# Patient Record
Sex: Female | Born: 1935 | Race: Black or African American | Hispanic: No | Smoking: Former smoker
Health system: Southern US, Community
[De-identification: ages and names within clinical notes are randomized; demographics above are authoritative.]

## PROBLEM LIST (undated history)

## (undated) DIAGNOSIS — Z9981 Dependence on supplemental oxygen: Secondary | ICD-10-CM

## (undated) DIAGNOSIS — I509 Heart failure, unspecified: Secondary | ICD-10-CM

---

## 2006-11-15 ENCOUNTER — Ambulatory Visit: Payer: Self-pay | Admitting: Gastroenterology

## 2008-01-29 ENCOUNTER — Ambulatory Visit: Payer: Self-pay | Admitting: Unknown Physician Specialty

## 2008-02-05 ENCOUNTER — Inpatient Hospital Stay: Payer: Self-pay | Admitting: Unknown Physician Specialty

## 2009-01-14 ENCOUNTER — Ambulatory Visit: Payer: Self-pay | Admitting: Unknown Physician Specialty

## 2009-01-27 ENCOUNTER — Inpatient Hospital Stay: Payer: Self-pay | Admitting: Unknown Physician Specialty

## 2009-02-01 ENCOUNTER — Ambulatory Visit: Payer: Self-pay | Admitting: Family Medicine

## 2010-06-09 ENCOUNTER — Ambulatory Visit: Payer: Self-pay | Admitting: Ophthalmology

## 2010-06-17 ENCOUNTER — Ambulatory Visit: Payer: Self-pay | Admitting: Ophthalmology

## 2011-09-09 ENCOUNTER — Ambulatory Visit: Payer: Self-pay | Admitting: Family

## 2012-06-21 ENCOUNTER — Ambulatory Visit: Payer: Self-pay | Admitting: Specialist

## 2012-07-13 ENCOUNTER — Ambulatory Visit: Payer: Self-pay | Admitting: Specialist

## 2014-04-22 DIAGNOSIS — G4733 Obstructive sleep apnea (adult) (pediatric): Secondary | ICD-10-CM | POA: Diagnosis present

## 2016-07-01 ENCOUNTER — Other Ambulatory Visit: Payer: Self-pay | Admitting: Gerontology

## 2016-07-01 ENCOUNTER — Ambulatory Visit
Admission: RE | Admit: 2016-07-01 | Discharge: 2016-07-01 | Disposition: A | Discharge status: Home / Self Care | Payer: Medicare (Managed Care) | Source: Ambulatory Visit | Attending: Gerontology | Admitting: Gerontology

## 2016-07-01 DIAGNOSIS — R05 Cough: Secondary | ICD-10-CM

## 2016-07-01 DIAGNOSIS — R059 Cough, unspecified: Secondary | ICD-10-CM

## 2016-07-01 DIAGNOSIS — N289 Disorder of kidney and ureter, unspecified: Secondary | ICD-10-CM | POA: Diagnosis not present

## 2016-07-01 DIAGNOSIS — R1084 Generalized abdominal pain: Secondary | ICD-10-CM

## 2016-07-01 DIAGNOSIS — K8020 Calculus of gallbladder without cholecystitis without obstruction: Secondary | ICD-10-CM | POA: Diagnosis not present

## 2016-07-01 DIAGNOSIS — R109 Unspecified abdominal pain: Secondary | ICD-10-CM | POA: Insufficient documentation

## 2016-07-01 DIAGNOSIS — R630 Anorexia: Secondary | ICD-10-CM

## 2016-07-01 DIAGNOSIS — I700 Atherosclerosis of aorta: Secondary | ICD-10-CM | POA: Diagnosis not present

## 2018-09-02 IMAGING — CR DG CHEST 2V
1 series · 2 of 2 positions shown · non-contrast
Comparison: None

CLINICAL DATA: Cough and fever.

EXAM:
CHEST  2 VIEW

[Series 1: dg chest 2 view · 0.14mm/px · 2 of 2 slices shown]
[im 1/2]
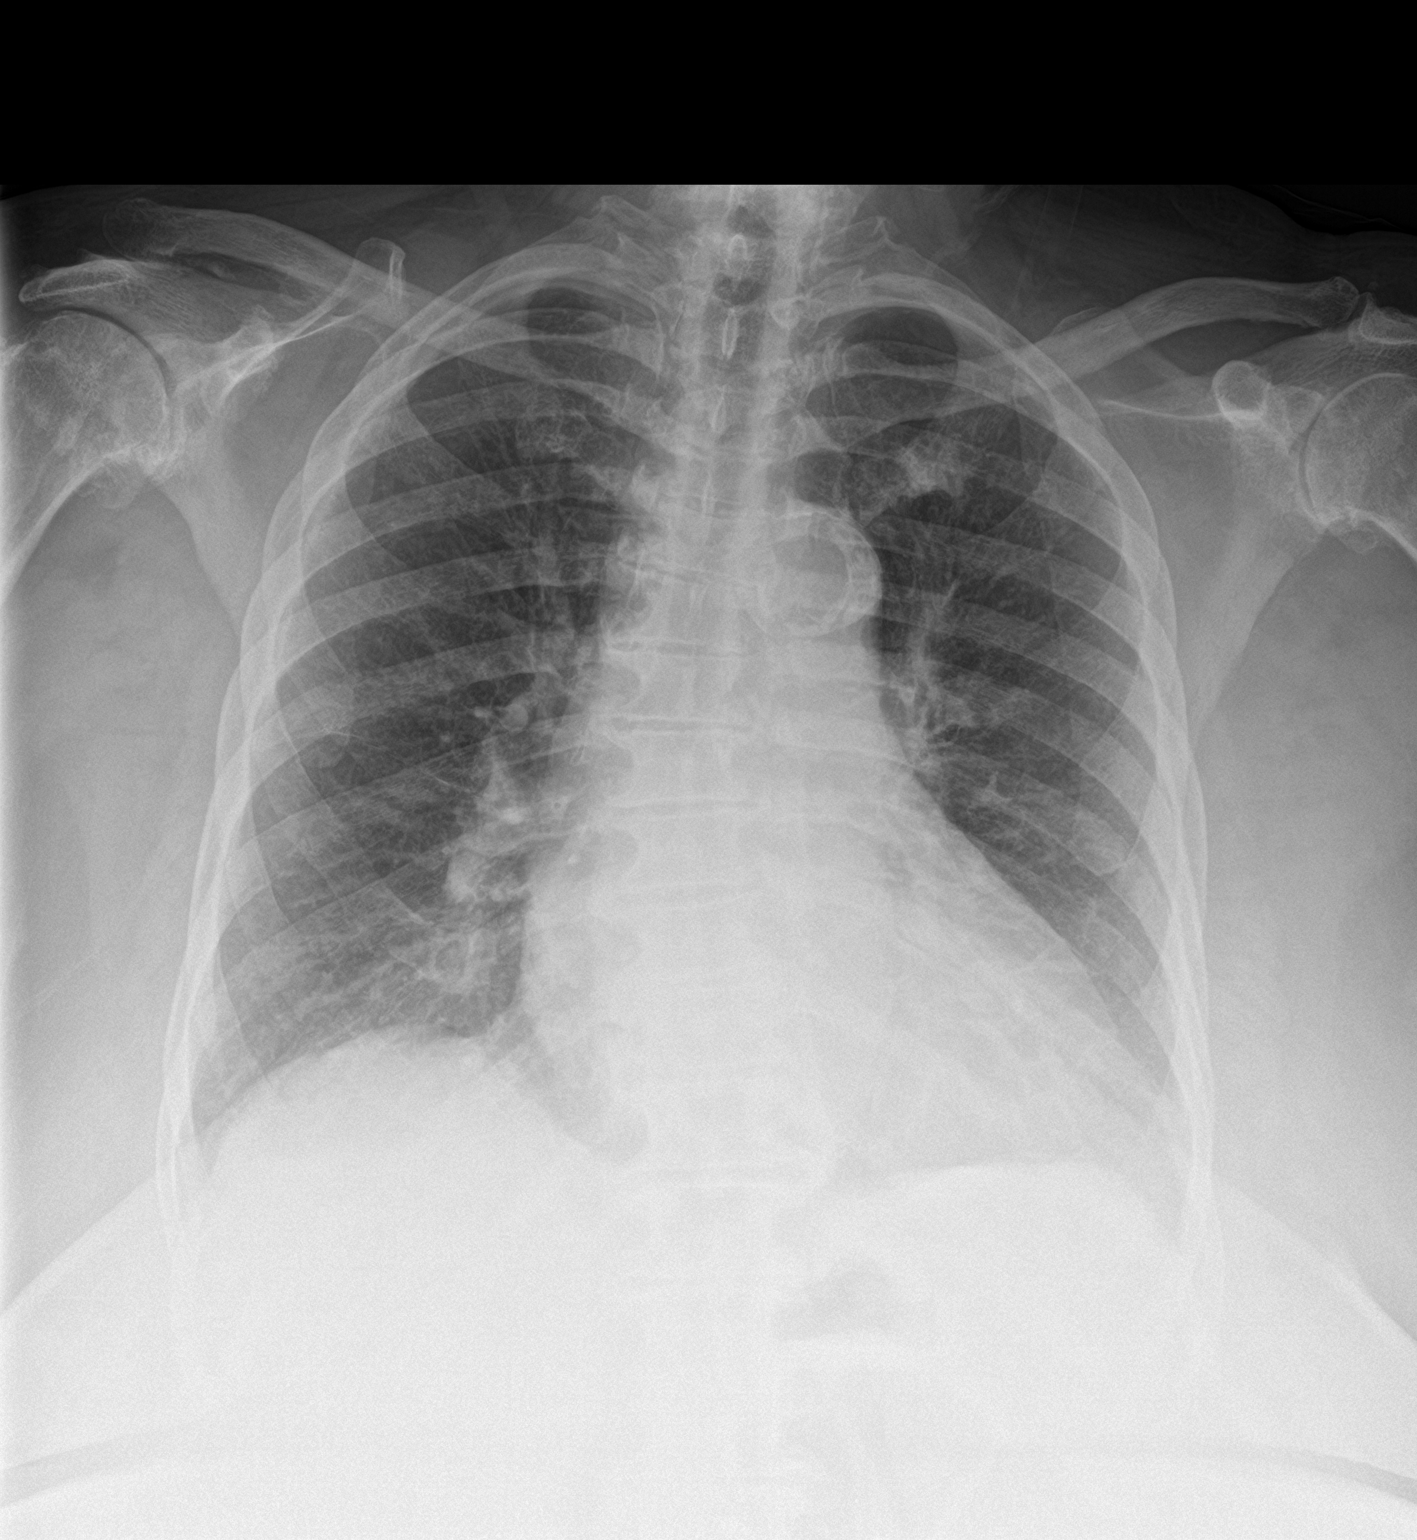
[im 2/2]
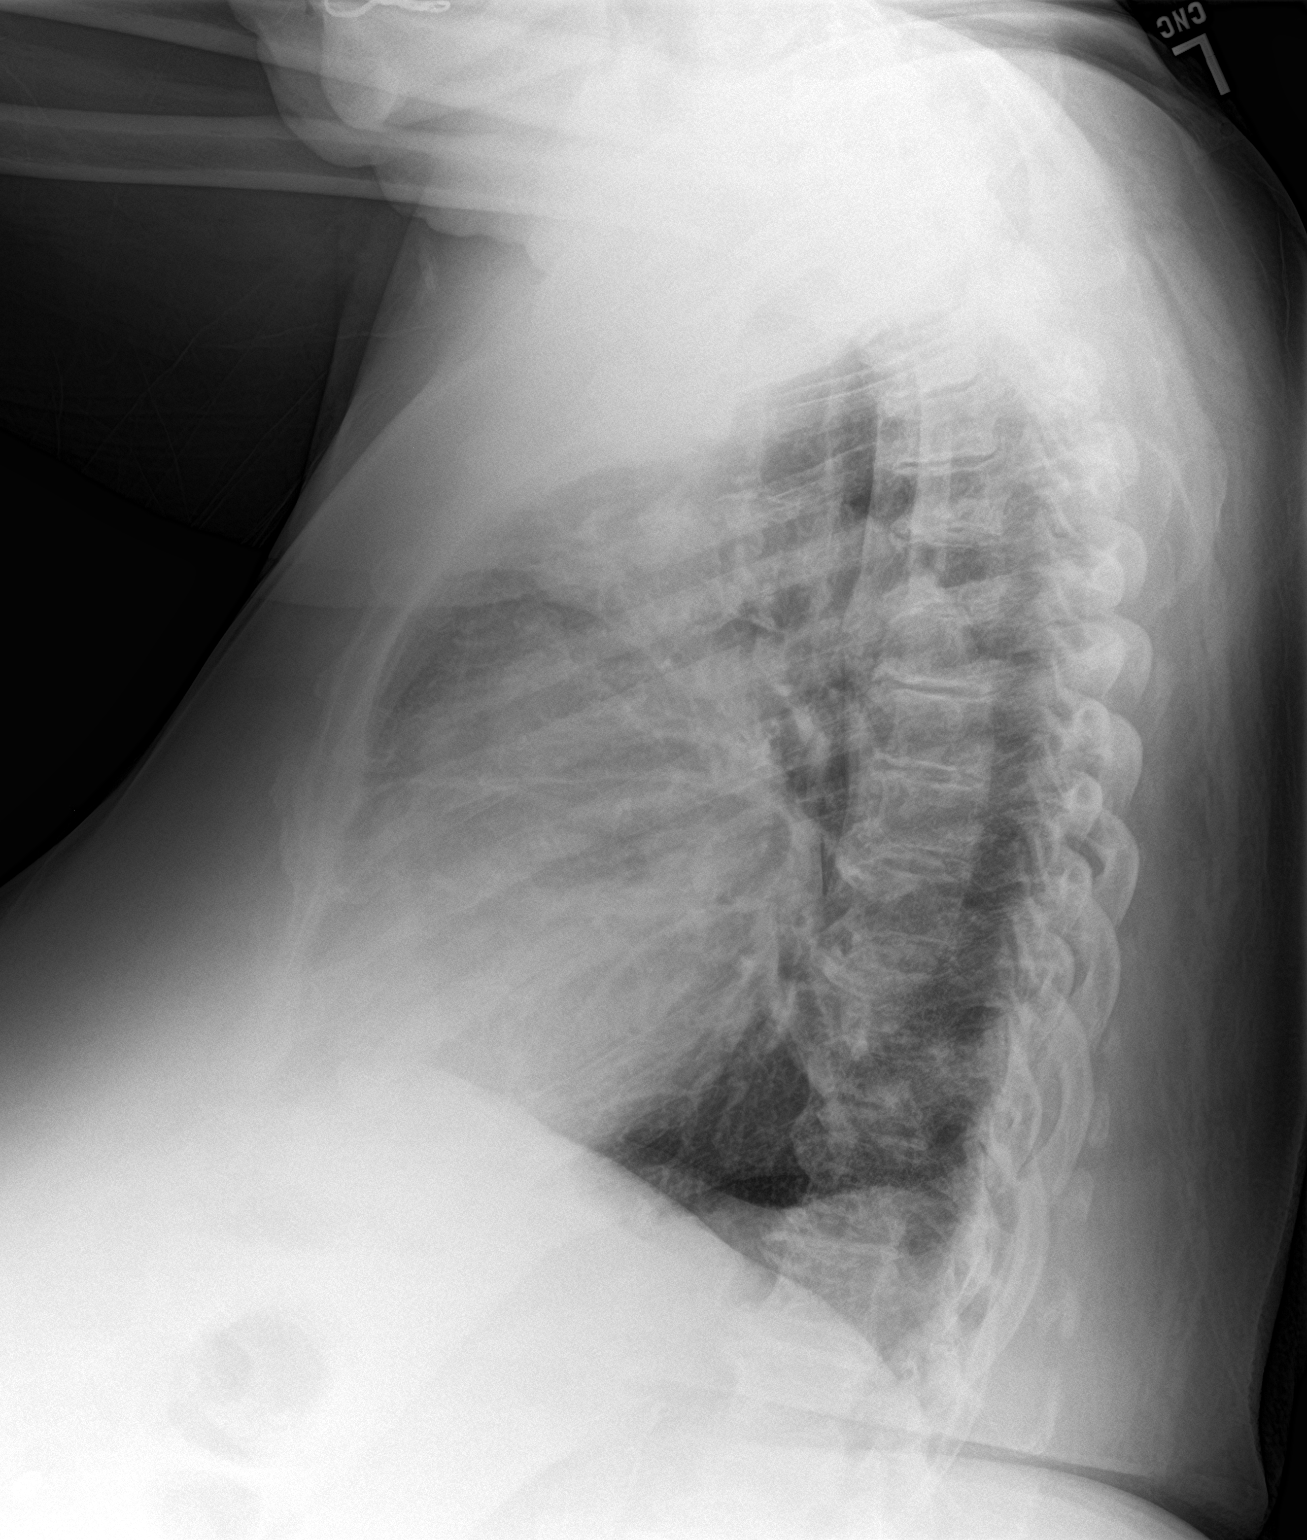

[2 of 2 positions shown; findings below may reference images not displayed]

FINDINGS: There is mild cardiac enlargement. Aortic atherosclerosis.
Calcification involving the aortic arch noted. No pleural effusion
or edema. No airspace opacities. Multi level spondylosis within the
thoracic spine.
IMPRESSION: 1. No acute findings.
2. Aortic atherosclerosis.

## 2022-07-17 ENCOUNTER — Emergency Department: Payer: Medicare (Managed Care)

## 2022-07-17 ENCOUNTER — Other Ambulatory Visit: Payer: Self-pay

## 2022-07-17 ENCOUNTER — Emergency Department
Admission: EM | Admit: 2022-07-17 | Discharge: 2022-07-17 | Disposition: A | Discharge status: Home / Self Care | Payer: Medicare (Managed Care) | Attending: Emergency Medicine | Admitting: Emergency Medicine

## 2022-07-17 DIAGNOSIS — W010XXA Fall on same level from slipping, tripping and stumbling without subsequent striking against object, initial encounter: Secondary | ICD-10-CM | POA: Diagnosis not present

## 2022-07-17 DIAGNOSIS — W19XXXA Unspecified fall, initial encounter: Secondary | ICD-10-CM

## 2022-07-17 DIAGNOSIS — M25522 Pain in left elbow: Secondary | ICD-10-CM | POA: Diagnosis not present

## 2022-07-17 DIAGNOSIS — Z87891 Personal history of nicotine dependence: Secondary | ICD-10-CM | POA: Insufficient documentation

## 2022-07-17 DIAGNOSIS — R4182 Altered mental status, unspecified: Secondary | ICD-10-CM | POA: Insufficient documentation

## 2022-07-17 DIAGNOSIS — M25512 Pain in left shoulder: Secondary | ICD-10-CM | POA: Diagnosis present

## 2022-07-17 LAB — URINALYSIS, W/ REFLEX TO CULTURE (INFECTION SUSPECTED)
Bilirubin Urine: NEGATIVE
Glucose, UA: >=500 mg/dL — AB
Ketones, ur: NEGATIVE mg/dL
Leukocytes,Ua: NEGATIVE
Nitrite: NEGATIVE
Protein, ur: 30 mg/dL — AB
Specific Gravity, Urine: 1.016 (ref 1.005–1.030)
pH: 6 (ref 5.0–8.0)

## 2022-07-17 LAB — COMPREHENSIVE METABOLIC PANEL
ALT: 10 U/L (ref 0–44)
AST: 32 U/L (ref 15–41)
Albumin: 3.3 g/dL — ABNORMAL LOW (ref 3.5–5.0)
Alkaline Phosphatase: 47 U/L (ref 38–126)
Anion gap: 16 — ABNORMAL HIGH (ref 5–15)
BUN: 26 mg/dL — ABNORMAL HIGH (ref 8–23)
CO2: 42 mmol/L — ABNORMAL HIGH (ref 22–32)
Calcium: 8.5 mg/dL — ABNORMAL LOW (ref 8.9–10.3)
Chloride: 81 mmol/L — ABNORMAL LOW (ref 98–111)
Creatinine, Ser: 0.84 mg/dL (ref 0.44–1.00)
GFR, Estimated: >60 mL/min (ref >60)
Glucose, Bld: 139 mg/dL — ABNORMAL HIGH (ref 70–99)
Potassium: 3 mmol/L — ABNORMAL LOW (ref 3.5–5.1)
Sodium: 139 mmol/L (ref 135–145)
Total Bilirubin: 1.6 mg/dL — ABNORMAL HIGH (ref 0.3–1.2)
Total Protein: 7.5 g/dL (ref 6.5–8.1)

## 2022-07-17 LAB — CBC WITH DIFFERENTIAL/PLATELET
Abs Immature Granulocytes: 0.05 10*3/uL (ref 0.00–0.07)
Basophils Absolute: 0 10*3/uL (ref 0.0–0.1)
Basophils Relative: 0 %
Eosinophils Absolute: 0 10*3/uL (ref 0.0–0.5)
Eosinophils Relative: 0 %
HCT: 46.1 % — ABNORMAL HIGH (ref 36.0–46.0)
Hemoglobin: 13.9 g/dL (ref 12.0–15.0)
Immature Granulocytes: 1 %
Lymphocytes Relative: 8 %
Lymphs Abs: 0.6 10*3/uL — ABNORMAL LOW (ref 0.7–4.0)
MCH: 32.7 pg (ref 26.0–34.0)
MCHC: 30.2 g/dL (ref 30.0–36.0)
MCV: 108.5 fL — ABNORMAL HIGH (ref 80.0–100.0)
Monocytes Absolute: 0.6 10*3/uL (ref 0.1–1.0)
Monocytes Relative: 7 %
Neutro Abs: 6.8 10*3/uL (ref 1.7–7.7)
Neutrophils Relative %: 84 %
Platelets: 160 10*3/uL (ref 150–400)
RBC: 4.25 MIL/uL (ref 3.87–5.11)
RDW: 13.2 % (ref 11.5–15.5)
WBC: 8.1 10*3/uL (ref 4.0–10.5)
nRBC: 0 % (ref 0.0–0.2)

## 2022-07-17 NOTE — ED Notes (Signed)
[facility] Called for transport to pts home

## 2022-07-17 NOTE — ED Notes (Signed)
EMS arrived to take patient home. Family at bedside.

## 2022-07-17 NOTE — ED Notes (Signed)
Pt states she slid out of wheelchair, c/o left arm hand and shoulder pain, unsure if on blood thinners. No bleeding or obvious deformity noted. Rugburn/abrasion noted on right wrist.

## 2022-07-17 NOTE — ED Triage Notes (Signed)
Pt presents via EMS from home. C/o fall at home. EMS reports uses a walker to get around living room. Wears 3L 02 at home. C/o left shoulder and left elbow pain. EMS reports pt is altered. A&Ox2 currently however usually A&O x4.

## 2022-07-17 NOTE — ED Notes (Signed)
Patient taken to CT scan. Patient was yelling for her family and states she wants to go home.

## 2022-07-17 NOTE — ED Notes (Signed)
Patient was cleaned up after being incontinent to urine. Linens were changed. Patient began urinating and urine specimen was sent. Patient tolerated procedure well.

## 2022-07-17 NOTE — ED Provider Notes (Signed)
[facility] Emergency Department Provider Note   ____________________________________________   Event Date/Time   First MD Initiated Contact with Patient 07/17/22 1524     (approximate)  I have reviewed the triage vital signs and the nursing notes.   HISTORY  Chief Complaint Fall    HPI Sheena Long is a 89 y.o. female presents to the emergency room via EMS for reported fall.  According to EMS report patient does ambulate around her home with a walker and is on continuous O2 at 3 L.  EMS reported the patient had tripped and fell.  However, on discussion with patient's nephew he states that she slid out of the recliner at 2 AM this morning and then today when they went to check on her they found her in the floor.  Nephew was adamant that she did not hit her head.  However, fall was unwitnessed.  Nephew states that it is not uncommon for patient to "slide out of the chair" several times a week.  Patient has an alarm system that she activates and it will notify him when he goes and gets her out of the floor.  Nephew also reports that patient is alert and oriented x 4.  But on exam, patient is alert and oriented x 2.  Patient states that she does not walk and that she did not fall.  Patient reports that she has had ongoing pain in her left shoulder and left arm.  Patient later reports that she hit her head when she fell into the chair.  It is hard to ascertain what actually happened other than patient had unwitnessed fall and will be treated for such.  Patient is noted to have bruising to her face.  Nephew states they noticed that this morning and are unsure where that came from.   History reviewed. No pertinent past medical history.  There are no problems to display for this patient.   History reviewed. No pertinent surgical history.  Prior to Admission medications   Not on File    Allergies Patient has no allergy information on record.  History  reviewed. No pertinent family history.  Social History Social History   Tobacco Use   Smoking status: Former    Types: Cigarettes   Smokeless tobacco: Never    Review of Systems  Constitutional: No fever/chills Eyes: No visual changes. ENT: No sore throat. Cardiovascular: Denies chest pain. Respiratory: Denies shortness of breath. Gastrointestinal: No abdominal pain.  No nausea, no vomiting.  No diarrhea.  No constipation. Genitourinary: Negative for dysuria. Musculoskeletal: Complains of left shoulder and left elbow pain Skin: Negative for rash. Neurological: Negative for headaches, focal weakness or numbness.   ____________________________________________   PHYSICAL EXAM:  VITAL SIGNS: ED Triage Vitals  Enc Vitals Group     BP 07/17/22 1404 101/85     Pulse Rate 07/17/22 1404 94     Resp 07/17/22 1404 18     Temp 07/17/22 1404 97.6 F (36.4 C)     Temp Source 07/17/22 1404 Oral     SpO2 07/17/22 1404 94 %     Weight --      Height --      Head Circumference --      Peak Flow --      Pain Score 07/17/22 1406 5     Pain Loc --      Pain Edu? --      Excl. in GC? --     Constitutional:  Alert and oriented x 2.  However, her baseline is reported to be oriented x 4. Well appearing and in no acute distress. Eyes: Conjunctivae are normal. PERRL. EOMI. Head: Bruising under bilateral eyes and on bilateral cheeks.  Worse to right cheek than left. Nose: No congestion/rhinnorhea. Mouth/Throat: Mucous membranes are moist.   Neck: No stridor.   Cardiovascular: Normal rate, regular rhythm. Grossly normal heart sounds.  Good peripheral circulation. Respiratory: Normal respiratory effort.  No retractions. Lungs CTAB. Gastrointestinal: Soft and nontender. No distention. No abdominal bruits.  Musculoskeletal: Patient has pain to bilateral lower extremities with weakness.  This is at her baseline. Neurologic:  Normal speech and language. No gross focal neurologic deficits are  appreciated, however, patient is alert and oriented x 2 when nephew reports her baseline is x 4.  Patient has altered gait at baseline. Skin:  Skin is warm, dry and intact. No rash noted. Psychiatric: Mood and affect are normal. Speech and behavior are normal.  ____________________________________________   LABS (all labs ordered are listed, but only abnormal results are displayed)  Labs Reviewed  CBC WITH DIFFERENTIAL/PLATELET - Abnormal; Notable for the following components:      Result Value   HCT 46.1 (*)    MCV 108.5 (*)    Lymphs Abs 0.6 (*)    All other components within normal limits  COMPREHENSIVE METABOLIC PANEL - Abnormal; Notable for the following components:   Potassium 3.0 (*)    Chloride 81 (*)    CO2 42 (*)    Glucose, Bld 139 (*)    BUN 26 (*)    Calcium 8.5 (*)    Albumin 3.3 (*)    Total Bilirubin 1.6 (*)    Anion gap 16 (*)    All other components within normal limits  URINALYSIS, W/ REFLEX TO CULTURE (INFECTION SUSPECTED) - Abnormal; Notable for the following components:   Color, Urine YELLOW (*)    APPearance CLEAR (*)    Glucose, UA >=500 (*)    Hgb urine dipstick MODERATE (*)    Protein, ur 30 (*)    Bacteria, UA RARE (*)    All other components within normal limits   ____________________________________________  EKG   ____________________________________________  RADIOLOGY  ED MD interpretation: CT reviewed by me and read by radiologist.  X-ray reviewed by me and read by radiologist.  Official radiology report(s): CT Head Wo Contrast  Result Date: 07/17/2022 CLINICAL DATA:  Altered mental status EXAM: CT HEAD WITHOUT CONTRAST TECHNIQUE: Contiguous axial images were obtained from the base of the skull through the vertex without intravenous contrast. RADIATION DOSE REDUCTION: This exam was performed according to the departmental dose-optimization program which includes automated exposure control, adjustment of the mA and/or kV according to  patient size and/or use of iterative reconstruction technique. COMPARISON:  None Available. FINDINGS: Brain: No evidence of acute infarction, hemorrhage, hydrocephalus, extra-axial collection or mass lesion/mass effect. There is mild periventricular white matter hypodensity, likely chronic small vessel ischemic change. Vascular: Atherosclerotic calcifications are present within the cavernous internal carotid arteries. Skull: Normal. Negative for fracture or focal lesion. Sinuses/Orbits: There is a small air-fluid level and mucosal thickening of the left maxillary sinus. Orbits are within normal limits. Other: None. IMPRESSION: 1. No acute intracranial process. 2. Mild chronic small vessel ischemic change. 3. Left maxillary sinus disease. Electronically Signed   By: Darliss Cheney M.D.   On: 07/17/2022 17:11   DG Elbow Complete Left  Result Date: 07/17/2022 CLINICAL DATA:  Left elbow pain.  No reported injury. EXAM: LEFT ELBOW - COMPLETE 3+ VIEW COMPARISON:  None Available. FINDINGS: Large posterior olecranon enthesophyte. Minimal coronoid process spur formation. No fracture, dislocation or definite effusion. IMPRESSION: Large posterior olecranon enthesophyte and minimal coronoid process spur formation. Electronically Signed   By: Beckie Salts M.D.   On: 07/17/2022 16:23   DG Shoulder Left  Result Date: 07/17/2022 CLINICAL DATA:  Left shoulder pain.  No reported injury. EXAM: LEFT SHOULDER - 2+ VIEW COMPARISON:  None Available. FINDINGS: Marked glenohumeral joint space narrowing with moderate inferior spur formation. Mild-to-moderate acromioclavicular spur formation and corticated fragmentation. Fracture or dislocation seen. Dense aortic arch atheromatous calcifications. IMPRESSION: Marked glenohumeral and mild-to-moderate acromioclavicular degenerative changes. Electronically Signed   By: Beckie Salts M.D.   On: 07/17/2022 16:22    ____________________________________________   PROCEDURES  Procedure(s)  performed: None  Procedures  Critical Care performed: No  ____________________________________________   INITIAL IMPRESSION / ASSESSMENT AND PLAN / ED COURSE     Sheena Long is a 89 y.o. female presents to the emergency room via EMS for reported fall.  According to EMS report patient does ambulate around her home with a walker and is on continuous O2 at 3 L.  EMS reported the patient had tripped and fell.  However, on discussion with patient's nephew he states that she slid out of the recliner at 2 AM this morning and then today when they went to check on her they found her in the floor.  Nephew was adamant that she did not hit her head.  However, fall was unwitnessed.  Nephew also reports that patient is alert and oriented x 4.  But on exam, patient is alert and oriented x 2.  Patient states that she does not walk and that she did not fall.  Patient reports that she has had ongoing pain in her left shoulder and left arm.  Patient later reports that she hit her head when she fell into the chair.  It is hard to ascertain what actually happened other than patient had unwitnessed fall and will be treated for such.  Patient is noted to have bruising to her face.  Nephew states they noticed that this morning and are unsure where that came from.  CBC and CMP were obtained while in triage. Based on fall being unwitnessed and patient stating that she did hit her head as well as bruising noted to the face, I will order CT of the head. Will also order urinalysis as patient has strong odor of urine about her.  CM P shows potassium of 3.0, chloride 81, CO2 42, glucose 139, BUN 26, calcium 8.5, albumin 3.3, bilirubin 1.6, anion gap 16. CBC relatively unremarkable. Discussed with patient and her nephew the need for IV hydration and for her to have potassium supplement given in the emergency room.  Patient declines at this time.  Nephew believes that she is oriented enough to make this decision on her  own.  I discussed her CMP results with him in detail and they declined any further intervention/treatment, stating that they want to get the x-rays and go home.  Left shoulder x-ray shows degenerative changes but no acute fracture or dislocation. Left elbow x-ray shows large posterior olecranon on enthesophyte and minimal coronoid process spur formation but no acute fracture or dislocation CT of head shows no acute intracranial processes.  UA shows greater than 500 glucose with positive hemoglobin/negative red blood cells and rare bacteria.  Will reflex with culture though suspicion  for UTI is low.  Based on lab results/x-ray/UA and patient's reassuring exam, I will discharge her home in stable condition at this time. Nephew is aware of discharge and would like EMS called to return her to her home.      ____________________________________________   FINAL CLINICAL IMPRESSION(S) / ED DIAGNOSES  Final diagnoses:  Fall, initial encounter  Altered mental status, unspecified altered mental status type     ED Discharge Orders     None        Note:  This document was prepared using Dragon voice recognition software and may include unintentional dictation errors.     Herschell Dimes, NP 07/17/22 Emelda Brothers    Phineas Semen, MD 07/17/22 [phone]

## 2022-07-17 NOTE — ED Notes (Signed)
Band aid applied to left elbow skin tear. Family at bedside. Packet of gel given to family for patient's lips. Patient has a shallow abrasion on left cheek and a shallow abrasion with redness on right wrist.

## 2022-07-17 NOTE — Discharge Instructions (Signed)
You have been seen today in the emergency room after having a unwitnessed fall.  Your workup is relatively reassuring.  Your x-rays/CT showed no signs of acute issues.  I recommend that you reach out to your primary care provider and follow-up with them within the week.

## 2022-07-28 ENCOUNTER — Encounter: Payer: Self-pay | Admitting: Emergency Medicine

## 2022-07-28 ENCOUNTER — Emergency Department: Payer: Medicare (Managed Care)

## 2022-07-28 ENCOUNTER — Other Ambulatory Visit: Payer: Self-pay

## 2022-07-28 ENCOUNTER — Emergency Department
Admission: EM | Admit: 2022-07-28 | Discharge: 2022-07-28 | Disposition: A | Discharge status: Home / Self Care | Payer: Medicare (Managed Care) | Attending: Emergency Medicine | Admitting: Emergency Medicine

## 2022-07-28 DIAGNOSIS — J168 Pneumonia due to other specified infectious organisms: Secondary | ICD-10-CM | POA: Insufficient documentation

## 2022-07-28 DIAGNOSIS — E876 Hypokalemia: Secondary | ICD-10-CM | POA: Diagnosis not present

## 2022-07-28 DIAGNOSIS — R0689 Other abnormalities of breathing: Secondary | ICD-10-CM | POA: Diagnosis present

## 2022-07-28 DIAGNOSIS — J189 Pneumonia, unspecified organism: Secondary | ICD-10-CM

## 2022-07-28 HISTORY — DX: Dependence on supplemental oxygen: Z99.81

## 2022-07-28 LAB — CBC WITH DIFFERENTIAL/PLATELET
Abs Immature Granulocytes: 0.02 10*3/uL (ref 0.00–0.07)
Basophils Absolute: 0 10*3/uL (ref 0.0–0.1)
Basophils Relative: 0 %
Eosinophils Absolute: 0.1 10*3/uL (ref 0.0–0.5)
Eosinophils Relative: 1 %
HCT: 46.1 % — ABNORMAL HIGH (ref 36.0–46.0)
Hemoglobin: 14 g/dL (ref 12.0–15.0)
Immature Granulocytes: 0 %
Lymphocytes Relative: 19 %
Lymphs Abs: 1.4 10*3/uL (ref 0.7–4.0)
MCH: 32.3 pg (ref 26.0–34.0)
MCHC: 30.4 g/dL (ref 30.0–36.0)
MCV: 106.5 fL — ABNORMAL HIGH (ref 80.0–100.0)
Monocytes Absolute: 0.7 10*3/uL (ref 0.1–1.0)
Monocytes Relative: 9 %
Neutro Abs: 5.4 10*3/uL (ref 1.7–7.7)
Neutrophils Relative %: 71 %
Platelets: 213 10*3/uL (ref 150–400)
RBC: 4.33 MIL/uL (ref 3.87–5.11)
RDW: 13.4 % (ref 11.5–15.5)
WBC: 7.6 10*3/uL (ref 4.0–10.5)
nRBC: 0 % (ref 0.0–0.2)

## 2022-07-28 LAB — COMPREHENSIVE METABOLIC PANEL
ALT: 10 U/L (ref 0–44)
AST: 18 U/L (ref 15–41)
Albumin: 3.3 g/dL — ABNORMAL LOW (ref 3.5–5.0)
Alkaline Phosphatase: 50 U/L (ref 38–126)
Anion gap: 14 (ref 5–15)
BUN: 26 mg/dL — ABNORMAL HIGH (ref 8–23)
CO2: 44 mmol/L — ABNORMAL HIGH (ref 22–32)
Calcium: 8.8 mg/dL — ABNORMAL LOW (ref 8.9–10.3)
Chloride: 82 mmol/L — ABNORMAL LOW (ref 98–111)
Creatinine, Ser: 0.95 mg/dL (ref 0.44–1.00)
GFR, Estimated: 58 mL/min — ABNORMAL LOW (ref >60)
Glucose, Bld: 81 mg/dL (ref 70–99)
Potassium: 2.9 mmol/L — ABNORMAL LOW (ref 3.5–5.1)
Sodium: 140 mmol/L (ref 135–145)
Total Bilirubin: 1.3 mg/dL — ABNORMAL HIGH (ref 0.3–1.2)
Total Protein: 7.5 g/dL (ref 6.5–8.1)

## 2022-07-28 MED ORDER — AZITHROMYCIN 500 MG PO TABS
500.0000 mg | ORAL_TABLET | Freq: Every day | ORAL | Status: DC
  Administered 2022-07-28: 500 mg via ORAL
  Filled 2022-07-28: qty 1

## 2022-07-28 MED ORDER — CEFDINIR 300 MG PO CAPS: 300.0000 mg | ORAL_CAPSULE | Freq: Two times a day (BID) | ORAL | 0 refills | Status: AC

## 2022-07-28 MED ORDER — POTASSIUM CHLORIDE 20 MEQ PO PACK
40.0000 meq | PACK | Freq: Two times a day (BID) | ORAL | Status: DC
  Administered 2022-07-28: 40 meq via ORAL
  Filled 2022-07-28: qty 2

## 2022-07-28 MED ORDER — CEFDINIR 300 MG PO CAPS
300.0000 mg | ORAL_CAPSULE | Freq: Two times a day (BID) | ORAL | Status: DC
  Filled 2022-07-28: qty 1

## 2022-07-28 MED ORDER — AZITHROMYCIN 250 MG PO TABS: ORAL_TABLET | ORAL | 0 refills | Status: DC

## 2022-07-28 MED ORDER — CEFDINIR 300 MG PO CAPS: 300.0000 mg | ORAL_CAPSULE | Freq: Two times a day (BID) | ORAL | Status: DC

## 2022-07-28 NOTE — Discharge Instructions (Signed)
It is very important that you take the antibiotics as prescribed for your pneumonia.  Please return if you have to increase your amount of oxygen or if you develop fever or any other new, worsening, or change in symptoms or other concerns.  It was a pleasure caring for you today.

## 2022-07-28 NOTE — ED Provider Triage Note (Signed)
Emergency Medicine Provider Triage Evaluation Note  Sheena Long , a 89 y.o. female  was evaluated in triage.  Pt states that family brought her here to be checked out. States that she fell at home yesterday.  Denies any injury or pain.  Normally on O2   Review of Systems  Positive: No injury.  Negative: No CP, SOB.  Physical Exam  BP 125/73 (BP Location: Left Arm)   Pulse 65   Temp 98 F (36.7 C)   Resp 20   Ht 5' (1.524 m)   Wt 83.9 kg   SpO2 97%   BMI 36.13 kg/m  Gen:   Awake, no distress  Talkative, answering questions.   Resp:  Normal effort   Lungs clear MSK:   Moves extremities without difficulty  Other:    Medical Decision Making  Medically screening exam initiated at 2:13 PM.  Appropriate orders placed.  Sheena Long was informed that the remainder of the evaluation will be completed by another provider, this initial triage assessment does not replace that evaluation, and the importance of remaining in the ED until their evaluation is complete.     Tommi Rumps, PA-C 07/28/22 1418

## 2022-07-28 NOTE — ED Triage Notes (Signed)
Arrives today c/o fall  yesterday.  Denies complaint.  Wearing 3l/ [place]

## 2022-07-28 NOTE — ED Provider Notes (Signed)
[facility] Provider Note    Event Date/Time   First MD Initiated Contact with Patient 07/28/22 1502     (approximate)   History   Fall   HPI  Sheena Long is a 89 y.o. female who presents today for evaluation of "change in her lung sounds" according to her nephew who is with her.  Nephew reports that he sees her every day and she has been acting her normal self.  She has not had any fevers or chills.  He reports that she has not had a cough.  There was no fall according to the nephew, contrary to triage note.  Patient wears 3 L via nasal cannula at all times according to nephew, she has not had to increase this at all recently.  She came over here according to the nephew because the doctor at the facility wanted her to have an x-ray and they did not have x-ray capability at her facility.  Patient denies any complaints currently.       Physical Exam   Triage Vital Signs: ED Triage Vitals  Enc Vitals Group     BP 07/28/22 1353 125/73     Pulse Rate 07/28/22 1353 65     Resp 07/28/22 1353 20     Temp 07/28/22 1353 98 F (36.7 C)     Temp src --      SpO2 07/28/22 1353 97 %     Weight 07/28/22 1410 185 lb (83.9 kg)     Height 07/28/22 1410 5' (1.524 m)     Head Circumference --      Peak Flow --      Pain Score 07/28/22 1410 0     Pain Loc --      Pain Edu? --      Excl. in GC? --     Most recent vital signs: Vitals:   07/28/22 1652 07/28/22 1757  BP:  123/75  Pulse:  67  Resp:  20  Temp:    SpO2: 98% 98%    Physical Exam Vitals and nursing note reviewed.  Constitutional:      General: Awake and alert. No acute distress.    Appearance: Normal appearance. The patient is normal weight.  HENT:     Head: Normocephalic and atraumatic.     Mouth: Mucous membranes are moist.  Eyes:     General: PERRL. Normal EOMs        Right eye: No discharge.        Left eye: No discharge.     Conjunctiva/sclera: Conjunctivae normal.   Cardiovascular:     Rate and Rhythm: Normal rate and regular rhythm.     Pulses: Normal pulses.  Pulmonary:     Effort: Pulmonary effort is normal. No respiratory distress.  No accessory muscle use.  Able to speak easily in complete sentences.  On 3 L nasal cannula.    Breath sounds: Rhonchi in left lower lung base, no wheezing or crackles Abdominal:     Abdomen is soft. There is no abdominal tenderness. No rebound or guarding. No distention. Musculoskeletal:        General: No swelling. Normal range of motion.     Cervical back: Normal range of motion and neck supple.  Skin:    General: Skin is warm and dry.     Capillary Refill: Capillary refill takes less than 2 seconds.     Findings: No rash.  Neurological:     Mental  Status: The patient is awake and alert.      ED Results / Procedures / Treatments   Labs (all labs ordered are listed, but only abnormal results are displayed) Labs Reviewed  CBC WITH DIFFERENTIAL/PLATELET - Abnormal; Notable for the following components:      Result Value   HCT 46.1 (*)    MCV 106.5 (*)    All other components within normal limits  COMPREHENSIVE METABOLIC PANEL - Abnormal; Notable for the following components:   Potassium 2.9 (*)    Chloride 82 (*)    CO2 44 (*)    BUN 26 (*)    Calcium 8.8 (*)    Albumin 3.3 (*)    Total Bilirubin 1.3 (*)    GFR, Estimated 58 (*)    All other components within normal limits     EKG     RADIOLOGY I independently reviewed and interpreted imaging and agree with radiologists findings.     PROCEDURES:  Critical Care performed:   Procedures   MEDICATIONS ORDERED IN ED: Medications  potassium chloride (KLOR-CON) packet 40 mEq (40 mEq Oral Given 07/28/22 1709)  azithromycin (ZITHROMAX) tablet 500 mg (500 mg Oral Given 07/28/22 1740)  cefdinir (OMNICEF) capsule 300 mg (has no administration in time range)     IMPRESSION / MDM / ASSESSMENT AND PLAN / ED COURSE  I reviewed the triage  vital signs and the nursing notes.   Differential diagnosis includes, but is not limited to, URI, cough, pneumonia, pneumothorax, bronchitis.  Patient is awake and alert, hemodynamically stable and afebrile.  She is with her nephew who reports that she is at her baseline.  There was no fall contrary to the triage note according to the nephew.  According to the nephew they are only here because of the need for a chest x-ray as her doctor at her facility noticed abnormal lung sounds and was unable to get an x-ray there.  There has not been any increase in her oxygen requirement.  Patient has not had any constitutional symptoms.  There has been no change in her mental status.  Labs obtained in triage are remarkable for hypokalemia to 2.9, similar to previous of 3.0.  No leukocytosis.  Her hypokalemia was repleted with p.o. potassium as she is not far from her baseline.  Chest x-ray reveals findings suspicious for left lower lobe pneumonia.  Given that patient has no change in her mental status, no leukocytosis, no fever, and has a normal oxygenation on her home oxygen, I do not feel that she needs to be admitted at this time.  I discussed this with her nephew who agrees.  However, we did discuss very strict return precautions in case of worsening illness.  Patient was given the first dose of her antibiotics in the emergency department.  I discussed with the nurse practitioner Verlon Au who cares for her at her living facility, and she agrees with plan.  We discussed return precautions and the importance of close outpatient follow-up.  Patient and her nephew understand and agree with plan.  Patient was transported back to her facility by transportation arranged by her facility.  Patient's presentation is most consistent with acute complicated illness / injury requiring diagnostic workup.     FINAL CLINICAL IMPRESSION(S) / ED DIAGNOSES   Final diagnoses:  Pneumonia of left lower lobe due to infectious  organism  Hypokalemia     Rx / DC Orders   ED Discharge Orders  Ordered    cefdinir (OMNICEF) 300 MG capsule  2 times daily        07/28/22 1653    azithromycin (ZITHROMAX Z-PAK) 250 MG tablet        07/28/22 1653             Note:  This document was prepared using Dragon voice recognition software and may include unintentional dictation errors.   Keturah Shavers 07/28/22 2241    Georga Hacking, MD 07/28/22 [phone]

## 2022-07-28 NOTE — ED Notes (Signed)
Pt facility transport vehicle arrived before pharmacy approved omnicef. Pt had to leave and medication was not given. Provider notified. Pt will get first dose in the morning at [place].

## 2022-09-18 ENCOUNTER — Emergency Department: Payer: Medicare (Managed Care)

## 2022-09-18 ENCOUNTER — Emergency Department
Admission: EM | Admit: 2022-09-18 | Discharge: 2022-09-19 | Disposition: A | Discharge status: Home / Self Care | Payer: Medicare (Managed Care) | Attending: Emergency Medicine | Admitting: Emergency Medicine

## 2022-09-18 ENCOUNTER — Other Ambulatory Visit: Payer: Self-pay

## 2022-09-18 ENCOUNTER — Encounter: Payer: Self-pay | Admitting: Emergency Medicine

## 2022-09-18 DIAGNOSIS — K5792 Diverticulitis of intestine, part unspecified, without perforation or abscess without bleeding: Secondary | ICD-10-CM

## 2022-09-18 DIAGNOSIS — R1084 Generalized abdominal pain: Secondary | ICD-10-CM | POA: Diagnosis present

## 2022-09-18 DIAGNOSIS — K5732 Diverticulitis of large intestine without perforation or abscess without bleeding: Secondary | ICD-10-CM | POA: Insufficient documentation

## 2022-09-18 DIAGNOSIS — I509 Heart failure, unspecified: Secondary | ICD-10-CM | POA: Insufficient documentation

## 2022-09-18 LAB — CBC WITH DIFFERENTIAL/PLATELET
Abs Immature Granulocytes: 0.03 10*3/uL (ref 0.00–0.07)
Basophils Absolute: 0 10*3/uL (ref 0.0–0.1)
Basophils Relative: 0 %
Eosinophils Absolute: 0.1 10*3/uL (ref 0.0–0.5)
Eosinophils Relative: 2 %
HCT: 43.5 % (ref 36.0–46.0)
Hemoglobin: 13.4 g/dL (ref 12.0–15.0)
Immature Granulocytes: 0 %
Lymphocytes Relative: 19 %
Lymphs Abs: 1.4 10*3/uL (ref 0.7–4.0)
MCH: 33.4 pg (ref 26.0–34.0)
MCHC: 30.8 g/dL (ref 30.0–36.0)
MCV: 108.5 fL — ABNORMAL HIGH (ref 80.0–100.0)
Monocytes Absolute: 0.8 10*3/uL (ref 0.1–1.0)
Monocytes Relative: 11 %
Neutro Abs: 4.9 10*3/uL (ref 1.7–7.7)
Neutrophils Relative %: 68 %
Platelets: 187 10*3/uL (ref 150–400)
RBC: 4.01 MIL/uL (ref 3.87–5.11)
RDW: 13.9 % (ref 11.5–15.5)
WBC: 7.2 10*3/uL (ref 4.0–10.5)
nRBC: 0 % (ref 0.0–0.2)

## 2022-09-18 LAB — COMPREHENSIVE METABOLIC PANEL
ALT: 8 U/L (ref 0–44)
AST: 17 U/L (ref 15–41)
Albumin: 3.4 g/dL — ABNORMAL LOW (ref 3.5–5.0)
Alkaline Phosphatase: 51 U/L (ref 38–126)
BUN: 27 mg/dL — ABNORMAL HIGH (ref 8–23)
CO2: >45 mmol/L — ABNORMAL HIGH (ref 22–32)
Calcium: 9.1 mg/dL (ref 8.9–10.3)
Chloride: 80 mmol/L — ABNORMAL LOW (ref 98–111)
Creatinine, Ser: 0.95 mg/dL (ref 0.44–1.00)
GFR, Estimated: 58 mL/min — ABNORMAL LOW (ref >60)
Glucose, Bld: 99 mg/dL (ref 70–99)
Potassium: 3.3 mmol/L — ABNORMAL LOW (ref 3.5–5.1)
Sodium: 143 mmol/L (ref 135–145)
Total Bilirubin: 1.2 mg/dL (ref 0.3–1.2)
Total Protein: 7.6 g/dL (ref 6.5–8.1)

## 2022-09-18 LAB — LACTIC ACID, PLASMA: Lactic Acid, Venous: 1.2 mmol/L (ref 0.5–1.9)

## 2022-09-18 LAB — LIPASE, BLOOD: Lipase: 22 U/L (ref 11–51)

## 2022-09-18 MED ORDER — AMOXICILLIN-POT CLAVULANATE 875-125 MG PO TABS
1.0000 | ORAL_TABLET | Freq: Once | ORAL | Status: AC
  Administered 2022-09-18: 1 via ORAL
  Filled 2022-09-18: qty 1

## 2022-09-18 MED ORDER — SODIUM CHLORIDE 0.9 % IV BOLUS: 1000.0000 mL | Freq: Once | INTRAVENOUS | Status: DC

## 2022-09-18 MED ORDER — AMOXICILLIN-POT CLAVULANATE 875-125 MG PO TABS: 1.0000 | ORAL_TABLET | Freq: Two times a day (BID) | ORAL | 0 refills | Status: AC

## 2022-09-18 MED ORDER — SODIUM CHLORIDE 0.9 % IV BOLUS
500.0000 mL | Freq: Once | INTRAVENOUS | Status: AC
  Administered 2022-09-18: 500 mL via INTRAVENOUS

## 2022-09-18 MED ORDER — IOHEXOL 300 MG/ML  SOLN
100.0000 mL | Freq: Once | INTRAMUSCULAR | Status: AC | PRN
  Administered 2022-09-18: 100 mL via INTRAVENOUS

## 2022-09-18 NOTE — ED Notes (Signed)
As we were preparing to use the bedpan to catch urine, the patient said she couldn't hold it, and released in her brief.  Her linens and bedding were changed.

## 2022-09-18 NOTE — ED Provider Notes (Signed)
[facility] Provider Note    Event Date/Time   First MD Initiated Contact with Patient 09/18/22 1839     (approximate)   History   Rectal Pain   HPI  Sheena Long is a 89 y.o. female   Past medical history of no documented PMH on medical record (attempt made to [facility] but no answer) but wears 3L [place] at baseline, medical history documented CHF history but echo in 2022 showed LV EF 55 to 60% and patient has no medical record documentation of diuretic therapy or cardiology notes, who is here from her living facility with rectal pain and diffuse abdominal pain over the last several days.  Has not had a bowel movement in several days.  Unclear if she is passing flatus.  She says she has had a an undisclosed unknown abdominal surgery many years ago.  She denies any dysuria or frequency.  No fever, chills, chest pain or respiratory symptoms.  Denies GI bleeding.   External Medical Documents Reviewed: 2022 echo showing LV EF 55-60%      Physical Exam   Triage Vital Signs: ED Triage Vitals [09/18/22 1822]  Enc Vitals Group     BP (!) 91/58     Pulse Rate 73     Resp 18     Temp 98.3 F (36.8 C)     Temp Source Oral     SpO2 94 %     Weight      Height      Head Circumference      Peak Flow      Pain Score 10     Pain Loc      Pain Edu?      Excl. in GC?     Most recent vital signs: Vitals:   09/18/22 2141 09/18/22 2142  BP: 119/70   Pulse: (!) 56   Resp: 18   Temp:  98.2 F (36.8 C)  SpO2: 97%     General: Awake, no distress. CV:  Good peripheral perfusion.  Resp:  Normal effort.  Abd:  No distention.  Other:  Soft abdomen, no rigidity or guarding, rectal exam without hemorrhoids or rectal lesions, brown stool without melena or blood.  Rectal exam with no obvious masses or fluctuance, trauma. she is hypotensive 90/50 otherwise hemodynamics appropriate reassuring no fever.  94% on her home 3 L nasal cannula.  Lungs clear.  Mucous  membranes appear dry.   ED Results / Procedures / Treatments   Labs (all labs ordered are listed, but only abnormal results are displayed) Labs Reviewed  COMPREHENSIVE METABOLIC PANEL - Abnormal; Notable for the following components:      Result Value   Potassium 3.3 (*)    Chloride 80 (*)    CO2 >45 (*)    BUN 27 (*)    Albumin 3.4 (*)    GFR, Estimated 58 (*)    All other components within normal limits  CBC WITH DIFFERENTIAL/PLATELET - Abnormal; Notable for the following components:   MCV 108.5 (*)    All other components within normal limits  LIPASE, BLOOD  LACTIC ACID, PLASMA  LACTIC ACID, PLASMA     I ordered and reviewed the above labs they are notable for lactic acid is normal as is the white blood cell count  EKG  ED ECG REPORT I, Pilar Jarvis, the attending physician, personally viewed and interpreted this ECG.   Date: 09/18/2022  EKG Time: 1933  Rate:  117  Rhythm: sinus w frequent pvcs  Axis: nl  ST&T Change: no stemi    RADIOLOGY I independently reviewed and interpreted CT scan of the abdomen pelvis and see no obvious obstructive or inflammatory changes.   PROCEDURES:  Critical Care performed: No  Procedures   MEDICATIONS ORDERED IN ED: Medications  sodium chloride 0.9 % bolus 500 mL (0 mLs Intravenous Stopped 09/18/22 2147)  iohexol (OMNIPAQUE) 300 MG/ML solution 100 mL (100 mLs Intravenous Contrast Given 09/18/22 2035)  amoxicillin-clavulanate (AUGMENTIN) 875-125 MG per tablet 1 tablet (1 tablet Oral Given 09/18/22 2146)    External physician / consultants:  I spoke with [facility] nurse regarding care plan for this patient.   IMPRESSION / MDM / ASSESSMENT AND PLAN / ED COURSE  I reviewed the triage vital signs and the nursing notes.                                Patient's presentation is most consistent with acute presentation with potential threat to life or bodily function.  Differential diagnosis includes, but is not limited to,  colo/proctitis, anorectal abscess, hemorrhoids, bowel obstruction, intra-abdominal infection, dehydration, electrolyte disturbance, hemorrhoids, urinary tract infection   The patient is on the cardiac monitor to evaluate for evidence of arrhythmia and/or significant heart rate changes.  MDM: This is a patient with abdominal pain and rectal pain with a normal-appearing rectal exam, benign abdominal exam patient reports abdominal pain and hypotension so will obtain CT abdomen pelvis with IV contrast to rule out surgical abdominal pathology, obstruction, infection.  Check urinalysis.  Check basic labs and electrolytes.  Appears dehydrated and hypotensive likely due to hypovolemia will start with 500 cc IV crystalloid given her documented history of CHF albeit with a normal-appearing echo from 2022.  ---  CT w early uncomplicated mild diverticulitis for which I rx'd abx.  Pt VS normalized.  Atherosclerotic changes likely chronic on CT; nl lactic and no pain out of proportion and very benign abd exam well appearance overall rules against mesenteric ischemic.   I considered hospitalization but given normal VS, well appearance, and mild diverticulitis on CT I think outpt treatment and f/u appropriate at this time.         FINAL CLINICAL IMPRESSION(S) / ED DIAGNOSES   Final diagnoses:  Diverticulitis     Rx / DC Orders   ED Discharge Orders          Ordered    amoxicillin-clavulanate (AUGMENTIN) 875-125 MG tablet  2 times daily        09/18/22 2135             Note:  This document was prepared using Dragon voice recognition software and may include unintentional dictation errors.    Pilar Jarvis, MD 09/18/22 [phone]

## 2022-09-18 NOTE — Discharge Instructions (Addendum)
Take antibiotics for the full course as prescribed.  See your doctor this week for a follow-up appointment.  If you have any new worsening or unexpected symptoms call your doctor right away or come back to the emergency department for reevaluation.

## 2022-09-18 NOTE — ED Triage Notes (Signed)
Pt to ER via EMS reports rectal pain.  Pt also endorses global abdominal pain.  Pt states BM yesterday.  Denies n/v/d.

## 2022-09-18 NOTE — ED Notes (Signed)
First Nurse Note: Pt to ED via [facility] from home for rectal pain.

## 2022-10-11 ENCOUNTER — Inpatient Hospital Stay
Admission: EM | Admit: 2022-10-11 | Discharge: 2022-10-15 | DRG: 378 | Disposition: A | Discharge status: Home / Self Care | Payer: Medicare (Managed Care) | Attending: Internal Medicine | Admitting: Internal Medicine

## 2022-10-11 ENCOUNTER — Other Ambulatory Visit: Payer: Self-pay

## 2022-10-11 ENCOUNTER — Encounter: Payer: Self-pay | Admitting: Emergency Medicine

## 2022-10-11 ENCOUNTER — Emergency Department: Payer: Medicare (Managed Care)

## 2022-10-11 DIAGNOSIS — Z79899 Other long term (current) drug therapy: Secondary | ICD-10-CM

## 2022-10-11 DIAGNOSIS — K5791 Diverticulosis of intestine, part unspecified, without perforation or abscess with bleeding: Secondary | ICD-10-CM | POA: Diagnosis not present

## 2022-10-11 DIAGNOSIS — K922 Gastrointestinal hemorrhage, unspecified: Secondary | ICD-10-CM | POA: Diagnosis present

## 2022-10-11 DIAGNOSIS — Z7951 Long term (current) use of inhaled steroids: Secondary | ICD-10-CM

## 2022-10-11 DIAGNOSIS — K5733 Diverticulitis of large intestine without perforation or abscess with bleeding: Secondary | ICD-10-CM | POA: Diagnosis present

## 2022-10-11 DIAGNOSIS — J9611 Chronic respiratory failure with hypoxia: Secondary | ICD-10-CM | POA: Diagnosis present

## 2022-10-11 DIAGNOSIS — N179 Acute kidney failure, unspecified: Secondary | ICD-10-CM | POA: Diagnosis present

## 2022-10-11 DIAGNOSIS — G4733 Obstructive sleep apnea (adult) (pediatric): Secondary | ICD-10-CM | POA: Diagnosis present

## 2022-10-11 DIAGNOSIS — R531 Weakness: Secondary | ICD-10-CM

## 2022-10-11 DIAGNOSIS — Z7984 Long term (current) use of oral hypoglycemic drugs: Secondary | ICD-10-CM

## 2022-10-11 DIAGNOSIS — Z66 Do not resuscitate: Secondary | ICD-10-CM | POA: Diagnosis present

## 2022-10-11 DIAGNOSIS — I872 Venous insufficiency (chronic) (peripheral): Secondary | ICD-10-CM | POA: Diagnosis present

## 2022-10-11 DIAGNOSIS — G250 Essential tremor: Secondary | ICD-10-CM | POA: Diagnosis present

## 2022-10-11 DIAGNOSIS — I4891 Unspecified atrial fibrillation: Principal | ICD-10-CM

## 2022-10-11 DIAGNOSIS — E785 Hyperlipidemia, unspecified: Secondary | ICD-10-CM | POA: Diagnosis present

## 2022-10-11 DIAGNOSIS — R296 Repeated falls: Secondary | ICD-10-CM | POA: Diagnosis present

## 2022-10-11 DIAGNOSIS — E1140 Type 2 diabetes mellitus with diabetic neuropathy, unspecified: Secondary | ICD-10-CM | POA: Diagnosis present

## 2022-10-11 DIAGNOSIS — F32A Depression, unspecified: Secondary | ICD-10-CM | POA: Diagnosis present

## 2022-10-11 DIAGNOSIS — Z6841 Body Mass Index (BMI) 40.0 and over, adult: Secondary | ICD-10-CM

## 2022-10-11 DIAGNOSIS — E876 Hypokalemia: Secondary | ICD-10-CM | POA: Diagnosis present

## 2022-10-11 DIAGNOSIS — K5792 Diverticulitis of intestine, part unspecified, without perforation or abscess without bleeding: Secondary | ICD-10-CM | POA: Diagnosis present

## 2022-10-11 DIAGNOSIS — Z87891 Personal history of nicotine dependence: Secondary | ICD-10-CM

## 2022-10-11 DIAGNOSIS — J449 Chronic obstructive pulmonary disease, unspecified: Secondary | ICD-10-CM | POA: Diagnosis present

## 2022-10-11 DIAGNOSIS — F03B3 Unspecified dementia, moderate, with mood disturbance: Secondary | ICD-10-CM | POA: Diagnosis present

## 2022-10-11 DIAGNOSIS — I480 Paroxysmal atrial fibrillation: Secondary | ICD-10-CM | POA: Diagnosis present

## 2022-10-11 DIAGNOSIS — E669 Obesity, unspecified: Secondary | ICD-10-CM | POA: Diagnosis present

## 2022-10-11 DIAGNOSIS — I493 Ventricular premature depolarization: Secondary | ICD-10-CM | POA: Diagnosis present

## 2022-10-11 DIAGNOSIS — I959 Hypotension, unspecified: Secondary | ICD-10-CM | POA: Diagnosis present

## 2022-10-11 DIAGNOSIS — I5032 Chronic diastolic (congestive) heart failure: Secondary | ICD-10-CM | POA: Diagnosis present

## 2022-10-11 DIAGNOSIS — Z9981 Dependence on supplemental oxygen: Secondary | ICD-10-CM | POA: Diagnosis not present

## 2022-10-11 DIAGNOSIS — D7589 Other specified diseases of blood and blood-forming organs: Secondary | ICD-10-CM | POA: Diagnosis present

## 2022-10-11 HISTORY — DX: Heart failure, unspecified: I50.9

## 2022-10-11 LAB — URINALYSIS, ROUTINE W REFLEX MICROSCOPIC
Bilirubin Urine: NEGATIVE
Glucose, UA: 150 mg/dL — AB
Hgb urine dipstick: NEGATIVE
Ketones, ur: NEGATIVE mg/dL
Nitrite: NEGATIVE
Protein, ur: NEGATIVE mg/dL
Specific Gravity, Urine: 1.013 (ref 1.005–1.030)
pH: 6 (ref 5.0–8.0)

## 2022-10-11 LAB — COMPREHENSIVE METABOLIC PANEL
ALT: 11 U/L (ref 0–44)
AST: 38 U/L (ref 15–41)
Albumin: 3.1 g/dL — ABNORMAL LOW (ref 3.5–5.0)
Alkaline Phosphatase: 47 U/L (ref 38–126)
Anion gap: 14 (ref 5–15)
BUN: 44 mg/dL — ABNORMAL HIGH (ref 8–23)
CO2: 39 mmol/L — ABNORMAL HIGH (ref 22–32)
Calcium: 8.3 mg/dL — ABNORMAL LOW (ref 8.9–10.3)
Chloride: 84 mmol/L — ABNORMAL LOW (ref 98–111)
Creatinine, Ser: 1.01 mg/dL — ABNORMAL HIGH (ref 0.44–1.00)
GFR, Estimated: 53 mL/min — ABNORMAL LOW (ref >60)
Glucose, Bld: 111 mg/dL — ABNORMAL HIGH (ref 70–99)
Potassium: 2.9 mmol/L — ABNORMAL LOW (ref 3.5–5.1)
Sodium: 137 mmol/L (ref 135–145)
Total Bilirubin: 1.5 mg/dL — ABNORMAL HIGH (ref 0.3–1.2)
Total Protein: 6.9 g/dL (ref 6.5–8.1)

## 2022-10-11 LAB — URINALYSIS, COMPLETE (UACMP) WITH MICROSCOPIC
Bilirubin Urine: NEGATIVE
Glucose, UA: 150 mg/dL — AB
Hgb urine dipstick: NEGATIVE
Ketones, ur: NEGATIVE mg/dL
Leukocytes,Ua: NEGATIVE
Nitrite: NEGATIVE
Protein, ur: NEGATIVE mg/dL
Specific Gravity, Urine: 1.013 (ref 1.005–1.030)
pH: 6 (ref 5.0–8.0)

## 2022-10-11 LAB — CBC
HCT: 41 % (ref 36.0–46.0)
Hemoglobin: 13 g/dL (ref 12.0–15.0)
MCH: 33.3 pg (ref 26.0–34.0)
MCHC: 31.7 g/dL (ref 30.0–36.0)
MCV: 105.1 fL — ABNORMAL HIGH (ref 80.0–100.0)
Platelets: 178 10*3/uL (ref 150–400)
RBC: 3.9 MIL/uL (ref 3.87–5.11)
RDW: 13.6 % (ref 11.5–15.5)
WBC: 9.2 10*3/uL (ref 4.0–10.5)
nRBC: 0 % (ref 0.0–0.2)

## 2022-10-11 LAB — PROTIME-INR
INR: 1.2 (ref 0.8–1.2)
Prothrombin Time: 15.2 seconds (ref 11.4–15.2)

## 2022-10-11 LAB — HEMOGLOBIN AND HEMATOCRIT, BLOOD
HCT: 41.8 % (ref 36.0–46.0)
Hemoglobin: 13.1 g/dL (ref 12.0–15.0)

## 2022-10-11 LAB — SODIUM, URINE, RANDOM: Sodium, Ur: 39 mmol/L

## 2022-10-11 LAB — MAGNESIUM: Magnesium: 2 mg/dL (ref 1.7–2.4)

## 2022-10-11 LAB — CREATININE, URINE, RANDOM: Creatinine, Urine: 64 mg/dL

## 2022-10-11 MED ORDER — POTASSIUM CHLORIDE 20 MEQ PO PACK
40.0000 meq | PACK | Freq: Once | ORAL | Status: AC
  Administered 2022-10-11: 40 meq via ORAL
  Filled 2022-10-11: qty 2

## 2022-10-11 MED ORDER — POTASSIUM CHLORIDE CRYS ER 20 MEQ PO TBCR
40.0000 meq | EXTENDED_RELEASE_TABLET | Freq: Once | ORAL | Status: DC
  Filled 2022-10-11: qty 2

## 2022-10-11 MED ORDER — LACTATED RINGERS IV BOLUS
1000.0000 mL | Freq: Once | INTRAVENOUS | Status: AC
  Administered 2022-10-11: 1000 mL via INTRAVENOUS

## 2022-10-11 MED ORDER — ACETAMINOPHEN 325 MG PO TABS: 650.0000 mg | ORAL_TABLET | Freq: Four times a day (QID) | ORAL | Status: DC | PRN

## 2022-10-11 MED ORDER — ACETAMINOPHEN 650 MG RE SUPP: 650.0000 mg | Freq: Four times a day (QID) | RECTAL | Status: DC | PRN

## 2022-10-11 MED ORDER — METOPROLOL TARTRATE 5 MG/5ML IV SOLN
5.0000 mg | Freq: Once | INTRAVENOUS | Status: AC
  Administered 2022-10-11: 5 mg via INTRAVENOUS
  Filled 2022-10-11: qty 5

## 2022-10-11 MED ORDER — POTASSIUM CHLORIDE 10 MEQ/100ML IV SOLN
10.0000 meq | INTRAVENOUS | Status: AC
  Administered 2022-10-11 – 2022-10-12 (×4): 10 meq via INTRAVENOUS
  Filled 2022-10-11 (×4): qty 100

## 2022-10-11 MED ORDER — PANTOPRAZOLE SODIUM 40 MG IV SOLR
40.0000 mg | Freq: Once | INTRAVENOUS | Status: AC
  Administered 2022-10-11: 40 mg via INTRAVENOUS
  Filled 2022-10-11: qty 10

## 2022-10-11 MED ORDER — LACTATED RINGERS IV SOLN: INTRAVENOUS | Status: AC

## 2022-10-11 MED ORDER — PANTOPRAZOLE SODIUM 40 MG IV SOLR
40.0000 mg | Freq: Two times a day (BID) | INTRAVENOUS | Status: DC
  Administered 2022-10-12 – 2022-10-13 (×3): 40 mg via INTRAVENOUS
  Filled 2022-10-11 (×3): qty 10

## 2022-10-11 MED ORDER — SODIUM CHLORIDE 0.9% FLUSH
3.0000 mL | Freq: Two times a day (BID) | INTRAVENOUS | Status: DC
  Administered 2022-10-11 – 2022-10-15 (×7): 3 mL via INTRAVENOUS

## 2022-10-11 NOTE — Assessment & Plan Note (Signed)
Carbonate suggest patient has chronic hypercapnia, continue with home oxygen.  CPAP as needed.

## 2022-10-11 NOTE — Assessment & Plan Note (Addendum)
Patient is felt to have lower GI bleed.  However I will still give empiric pantoprazole IV.  I will check PTT.  Type and screen has been sent.  Patient is having hemodynamic instability that may be related to GI bleed versus metoprolol administration for rate control.  We will give additional 1 L bolus as mentioned.  Check 2 AM CBC and then for a.m. CBC.  Transfuse as needed.  At this time I do not think patient is actively bleeding such that getting a CAT scan would not help localize the bleeding.  However if patient has recurrence of bleeding actively or marked drop in CBC at that time we will pursue CT abdomen pelvis with contrast.  Patient is not having a white count or fever to suggest colitis.  Patient is not complaining of abdominal pain.  No suggestion of cirrhosis given no thrombocytopenia and LFTs normal, bilirubin elevation is negligible and not suggestive of cirrhosis at this time

## 2022-10-11 NOTE — H&P (Addendum)
History and Physical    Patient: Sheena Long:[id number] DOB: 01-Feb-1932 DOA: 10/11/2022 DOS: the patient was seen and examined on 10/11/2022 PCP: Precious Reel, NP (Inactive)  [facility] [place]  Chief Complaint:  Chief Complaint  Patient presents with   Rectal Bleeding   HPI: Sheena Long is a 89 y.o. female with medical history significant of moderate dementia. Patietn recall of the events of the day is limited.  She recalls falling down, but does not remember why.   Per report, patient is a resident of [facility] and has been having bloody stools with blood apparent after the patient wipes.  She has just developed generalized weakness and may have had fall earlier today.  There is no report of patient passing out having tremors or focal weakness.  There is no report of any other bleeding such as gum bleeding skin bruising or nosebleeding.  Patient was apparently seen for this reason at the clinic prior to transfer to [facility] ER.  At baseline patient requires a 4 L/min of oxygen by nasal cannula chronically.  Patient's ER course is notable for patient being hypotensive initially on presentation.  Patient received a liter of lactated Ringer's with fair response of blood pressure.  Subsequently patient was noted to be in atrial fibrillation with tachycardia, patient has since then received metoprolol and is now having soft blood pressures again.  I have ordered another liter of lactated Ringer.  Patient was initially felt to be somnolent in the ER.  However at my evaluation is alert awake and interactive.  Patient offers no complaints is not aware of any rectal bleeding.  Would like to rest and sleep.  Is not having any abdominal pain or vomiting.    Review of Systems: unable to review all systems due to the inability of the patient to answer questions. Past Medical History:  Diagnosis Date   CHF (congestive heart failure) (HCC)    On home oxygen  therapy    3l/ [place]   History reviewed. No pertinent surgical history. Social History:  reports that she has quit smoking. Her smoking use included cigarettes. She has never used smokeless tobacco. No history on file for alcohol use and drug use. History is obtained from documentation sent from [facility].  I will mention only chronic illnesses here. Chronic diastolic heart failure Dystrophic nails Obesity Edentulous COPD stage I mild Alopecia Cholelithiasis Benign essential tremor Dementia moderate Osteoarthritis of multiple joints Obstructive sleep apnea Venous insufficiency unspecified Peripheral neuropathy Depression in remission Dyslipidemia Prediabetes No Known Allergies  History reviewed. No pertinent family history.  Prior to Admission medications   Medication Sig Start Date End Date Taking? Authorizing Provider  azithromycin (ZITHROMAX Z-PAK) 250 MG tablet Take 2 tablets at once on day 1 followed by 1 tablet each day for days 2-5 07/28/22   Jackelyn Hoehn, PA-C    Physical Exam: Vitals:   10/11/22 2130 10/11/22 2200 10/11/22 2230 10/11/22 2247  BP: (!) 102/54 100/71 112/80   Pulse: 69 73 (!) 115   Resp: 20 (!) 31 (!) 22   Temp:    98 F (36.7 C)  TempSrc:    Oral  SpO2: 100% 100% 100%    General: Alert awake does not appear to be distress, obesity Patient is interactive, oriented to location, tells me she is at [facility] Unable to give me date or month or year.  Able to give me date of birth. Respiratory  exam: Bilateral intravesicular Cardiovascular exam S1-S2 normal tachycardic irregular Abdomen bowel sounds present all quadrants soft nontender Rectal exam was done by ER provider which showed maroon-colored stools.  I did asked the patient to turn in the bed and on external exam, there is no obvious bleeding going on at this time. Extremities warm without edema Patient is markedly deconditioned, however no focal motor deficit is  noted. Data Reviewed:  Labs on Admission:  Results for orders placed or performed during the hospital encounter of 10/11/22 (from the past 24 hour(s))  Comprehensive metabolic panel     Status: Abnormal   Collection Time: 10/11/22  6:53 PM  Result Value Ref Range   Sodium 137 135 - 145 mmol/L   Potassium 2.9 (L) 3.5 - 5.1 mmol/L   Chloride 84 (L) 98 - 111 mmol/L   CO2 39 (H) 22 - 32 mmol/L   Glucose, Bld 111 (H) 70 - 99 mg/dL   BUN 44 (H) 8 - 23 mg/dL   Creatinine, Ser 7.82 (H) 0.44 - 1.00 mg/dL   Calcium 8.3 (L) 8.9 - 10.3 mg/dL   Total Protein 6.9 6.5 - 8.1 g/dL   Albumin 3.1 (L) 3.5 - 5.0 g/dL   AST 38 15 - 41 U/L   ALT 11 0 - 44 U/L   Alkaline Phosphatase 47 38 - 126 U/L   Total Bilirubin 1.5 (H) 0.3 - 1.2 mg/dL   GFR, Estimated 53 (L) >60 mL/min   Anion gap 14 5 - 15  CBC     Status: Abnormal   Collection Time: 10/11/22  6:53 PM  Result Value Ref Range   WBC 9.2 4.0 - 10.5 K/uL   RBC 3.90 3.87 - 5.11 MIL/uL   Hemoglobin 13.0 12.0 - 15.0 g/dL   HCT 95.6 21.3 - 08.6 %   MCV 105.1 (H) 80.0 - 100.0 fL   MCH 33.3 26.0 - 34.0 pg   MCHC 31.7 30.0 - 36.0 g/dL   RDW 57.8 46.9 - 62.9 %   Platelets 178 150 - 400 K/uL   nRBC 0.0 0.0 - 0.2 %  Type and screen [facility]     Status: None (Preliminary result)   Collection Time: 10/11/22  6:53 PM  Result Value Ref Range   ABO/RH(D) PENDING    Antibody Screen PENDING    Sample Expiration      10/14/2022,2359 Performed at [facility], [address] [place], [place] [address]   Protime-INR     Status: None   Collection Time: 10/11/22  8:56 PM  Result Value Ref Range   Prothrombin Time 15.2 11.4 - 15.2 seconds   INR 1.2 0.8 - 1.2  Urinalysis, Routine w reflex microscopic -Urine, Clean Catch     Status: Abnormal   Collection Time: 10/11/22 10:05 PM  Result Value Ref Range   Color, Urine YELLOW (A) YELLOW   APPearance CLEAR (A) CLEAR   Specific Gravity, Urine 1.013 1.005 - 1.030   pH 6.0 5.0 -  8.0   Glucose, UA 150 (A) NEGATIVE mg/dL   Hgb urine dipstick NEGATIVE NEGATIVE   Bilirubin Urine NEGATIVE NEGATIVE   Ketones, ur NEGATIVE NEGATIVE mg/dL   Protein, ur NEGATIVE NEGATIVE mg/dL   Nitrite NEGATIVE NEGATIVE   Leukocytes,Ua TRACE (A) NEGATIVE   RBC / HPF 0-5 0 - 5 RBC/hpf   WBC, UA 0-5 0 - 5 WBC/hpf   Bacteria, UA RARE (A) NONE SEEN   Squamous Epithelial / HPF 0-5 0 - 5 /  HPF   Mucus PRESENT    Hyaline Casts, UA PRESENT   Magnesium     Status: None   Collection Time: 10/11/22 10:17 PM  Result Value Ref Range   Magnesium 2.0 1.7 - 2.4 mg/dL  Hemoglobin and hematocrit, blood     Status: None   Collection Time: 10/11/22 10:17 PM  Result Value Ref Range   Hemoglobin 13.1 12.0 - 15.0 g/dL   HCT 16.1 09.6 - 04.5 %   Basic Metabolic Panel: Recent Labs  Lab 10/11/22 1853 10/11/22 2217  NA 137  --   K 2.9*  --   CL 84*  --   CO2 39*  --   GLUCOSE 111*  --   BUN 44*  --   CREATININE 1.01*  --   CALCIUM 8.3*  --   MG  --  2.0   Liver Function Tests: Recent Labs  Lab 10/11/22 1853  AST 38  ALT 11  ALKPHOS 47  BILITOT 1.5*  PROT 6.9  ALBUMIN 3.1*   No results for input(s): "LIPASE", "AMYLASE" in the last 168 hours. No results for input(s): "AMMONIA" in the last 168 hours. CBC: Recent Labs  Lab 10/11/22 1853 10/11/22 2217  WBC 9.2  --   HGB 13.0 13.1  HCT 41.0 41.8  MCV 105.1*  --   PLT 178  --    Cardiac Enzymes: No results for input(s): "CKTOTAL", "CKMB", "CKMBINDEX", "TROPONINIHS" in the last 168 hours.  BNP (last 3 results) No results for input(s): "PROBNP" in the last 8760 hours. CBG: No results for input(s): "GLUCAP" in the last 168 hours.  Radiological Exams on Admission:  DG Chest Portable 1 View  Result Date: 10/11/2022 CLINICAL DATA:  Weakness EXAM: PORTABLE CHEST 1 VIEW COMPARISON:  07/28/2022 FINDINGS: Cardiac shadow is enlarged but stable. Aortic calcifications are again seen. Lungs are well aerated bilaterally. Mild left basilar  scarring is noted. No acute abnormality noted. IMPRESSION: Mild left basilar scarring Electronically Signed   By: Alcide Clever M.D.   On: 10/11/2022 21:28   CT Head Wo Contrast  Result Date: 10/11/2022 CLINICAL DATA:  Recent fall with headaches and neck pain, initial encounter EXAM: CT HEAD WITHOUT CONTRAST CT CERVICAL SPINE WITHOUT CONTRAST TECHNIQUE: Multidetector CT imaging of the head and cervical spine was performed following the standard protocol without intravenous contrast. Multiplanar CT image reconstructions of the cervical spine were also generated. RADIATION DOSE REDUCTION: This exam was performed according to the departmental dose-optimization program which includes automated exposure control, adjustment of the mA and/or kV according to patient size and/or use of iterative reconstruction technique. COMPARISON:  07/17/2022 FINDINGS: CT HEAD FINDINGS Brain: No evidence of acute infarction, hemorrhage, hydrocephalus, extra-axial collection or mass lesion/mass effect. Mild chronic white matter ischemic changes are seen. Vascular: No hyperdense vessel or unexpected calcification. Skull: Normal. Negative for fracture or focal lesion. Sinuses/Orbits: No acute finding. Other: None. CT CERVICAL SPINE FINDINGS Alignment: Mild loss of the normal cervical lordosis is noted. Skull base and vertebrae: 7 cervical segments are well visualized. Multilevel osteophytic change and facet hypertrophic changes are noted. No acute fracture or acute facet abnormality is noted. The odontoid is within normal limits. Soft tissues and spinal canal: Surrounding soft tissue structures are within normal limits. Heavy atherosclerotic calcifications are seen. Upper chest: Visualized lung apices are within normal limits. Other: None IMPRESSION: CT of the head: No acute intracranial abnormality noted. CT of the cervical spine: Multilevel degenerative change without acute abnormality. Electronically Signed   By:  Alcide Clever M.D.   On:  10/11/2022 21:25   CT Cervical Spine Wo Contrast  Result Date: 10/11/2022 CLINICAL DATA:  Recent fall with headaches and neck pain, initial encounter EXAM: CT HEAD WITHOUT CONTRAST CT CERVICAL SPINE WITHOUT CONTRAST TECHNIQUE: Multidetector CT imaging of the head and cervical spine was performed following the standard protocol without intravenous contrast. Multiplanar CT image reconstructions of the cervical spine were also generated. RADIATION DOSE REDUCTION: This exam was performed according to the departmental dose-optimization program which includes automated exposure control, adjustment of the mA and/or kV according to patient size and/or use of iterative reconstruction technique. COMPARISON:  07/17/2022 FINDINGS: CT HEAD FINDINGS Brain: No evidence of acute infarction, hemorrhage, hydrocephalus, extra-axial collection or mass lesion/mass effect. Mild chronic white matter ischemic changes are seen. Vascular: No hyperdense vessel or unexpected calcification. Skull: Normal. Negative for fracture or focal lesion. Sinuses/Orbits: No acute finding. Other: None. CT CERVICAL SPINE FINDINGS Alignment: Mild loss of the normal cervical lordosis is noted. Skull base and vertebrae: 7 cervical segments are well visualized. Multilevel osteophytic change and facet hypertrophic changes are noted. No acute fracture or acute facet abnormality is noted. The odontoid is within normal limits. Soft tissues and spinal canal: Surrounding soft tissue structures are within normal limits. Heavy atherosclerotic calcifications are seen. Upper chest: Visualized lung apices are within normal limits. Other: None IMPRESSION: CT of the head: No acute intracranial abnormality noted. CT of the cervical spine: Multilevel degenerative change without acute abnormality. Electronically Signed   By: Alcide Clever M.D.   On: 10/11/2022 21:25    EKG: Independently reviewed.  A-fib with RVR   Assessment and Plan: * GI bleeding Patient is felt to  have lower GI bleed.  However I will still give empiric pantoprazole IV.  I will check PTT.  Type and screen has been sent.  Patient is having hemodynamic instability that may be related to GI bleed versus metoprolol administration for rate control.  We will give additional 1 L bolus as mentioned.  Check 2 AM CBC and then for a.m. CBC.  Transfuse as needed.  At this time I do not think patient is actively bleeding such that getting a CAT scan would not help localize the bleeding.  However if patient has recurrence of bleeding actively or marked drop in CBC at that time we will pursue CT abdomen pelvis with contrast.  Patient is not having a white count or fever to suggest colitis.  Patient is not complaining of abdominal pain.  No suggestion of cirrhosis given no thrombocytopenia and LFTs normal, bilirubin elevation is negligible and not suggestive of cirrhosis at this time  AKI (acute kidney injury) (HCC) With seemingly concurrent/proportional rise of BUN and creatinine.  Likely prerenal given blood loss in the GI tract..  Will treat with IV fluids, check urinalysis sodium creatinine intake output.  Hypokalemia Mg was normal.  Patient has been ordered for 40 mEq IV KCl.  I will give additional 40 mEq p.o. once.  2 AM check for potassium  Atrial fibrillation with RVR (HCC) This is a new diagnosis.  Associated with soft blood pressures.  For this time I will not worry about a heart rate unless its over 130/min.  Sustaining at rest.  With plan for first-line therapy to be IV fluids.  Before administration of rate control medications.  We will check thyroid cascade and echo.  Patient is certainly not a candidate for anticoagulation this evening.  Sleep apnea, obstructive Carbonate suggest patient has  chronic hypercapnia, continue with home oxygen.  CPAP as needed.   Med Rec pending.   Advance Care Planning:   Code Status: Not on file bedside form available, see media section for scanned copy.  DNR/DNI,  limited medical interventions  Consults: Consider gastroenterology consult in a.m.  Patient is limited medical interventions  Family Communication: Per ER report, ER was in communication with family earlier.  At this time it is pretty late to call family.  Severity of Illness: The appropriate patient status for this patient is INPATIENT. Inpatient status is judged to be reasonable and necessary in order to provide the required intensity of service to ensure the patient's safety. The patient's presenting symptoms, physical exam findings, and initial radiographic and laboratory data in the context of their chronic comorbidities is felt to place them at high risk for further clinical deterioration. Furthermore, it is not anticipated that the patient will be medically stable for discharge from the hospital within 2 midnights of admission.   * I certify that at the point of admission it is my clinical judgment that the patient will require inpatient hospital care spanning beyond 2 midnights from the point of admission due to high intensity of service, high risk for further deterioration and high frequency of surveillance required.*  Author: Nolberto Hanlon, MD 10/11/2022 11:02 PM  For on call review [email or web address].

## 2022-10-11 NOTE — ED Triage Notes (Signed)
Patient to ED via [facility] from home for rectal bleeding. Started approx 1 hr- seen by home health RN. Family unsure of what it looked like.

## 2022-10-11 NOTE — ED Notes (Signed)
First nurse notes: Pt here via [facility] with c/o of GI bleed, EMS states bright red blood when wiping. EMS states pt seen at clinic today for same.   BP: 93/60 HR: 110  96% on 4/Lmin via [place] chronically.

## 2022-10-11 NOTE — Assessment & Plan Note (Addendum)
This is a new diagnosis.  Associated with soft blood pressures.  For this time I will not worry about a heart rate unless its over 130/min.  Sustaining at rest.  With plan for first-line therapy to be IV fluids.  Before administration of rate control medications.  We will check thyroid cascade and echo.  Patient is certainly not a candidate for anticoagulation this evening.

## 2022-10-11 NOTE — Assessment & Plan Note (Addendum)
Mg was normal.  Patient has been ordered for 40 mEq IV KCl.  I will give additional 40 mEq p.o. once.  2 AM check for potassium

## 2022-10-11 NOTE — Assessment & Plan Note (Signed)
With seemingly concurrent/proportional rise of BUN and creatinine.  Likely prerenal given blood loss in the GI tract..  Will treat with IV fluids, check urinalysis sodium creatinine intake output.

## 2022-10-11 NOTE — ED Provider Notes (Signed)
[facility] Provider Note    Event Date/Time   First MD Initiated Contact with Patient 10/11/22 2023     (approximate)   History   Chief Complaint Rectal Bleeding   HPI  Sheena Long is a 89 y.o. female with past medical history of dementia, COPD, CHF, and chronic hypoxic respiratory failure on 3 L nasal cannula who presents to the ED complaining of rectal bleeding.  Family reports that patient has been increasingly weak over the course of the day today and had a fall earlier today where she struck her head.  Her home health nurse then noticed that she had blood in her stool just prior to arrival.  Patient denies any fevers, cough, chest pain, shortness of breath, nausea, vomiting, or abdominal pain.  Family believes that patient takes a blood thinner but they are not sure which one.     Physical Exam   Triage Vital Signs: ED Triage Vitals  Enc Vitals Group     BP 10/11/22 1855 (!) 95/55     Pulse Rate 10/11/22 1855 (!) 50     Resp 10/11/22 1855 18     Temp 10/11/22 1855 97.7 F (36.5 C)     Temp Source 10/11/22 1855 Oral     SpO2 10/11/22 1855 99 %     Weight --      Height --      Head Circumference --      Peak Flow --      Pain Score 10/11/22 1853 10     Pain Loc --      Pain Edu? --      Excl. in GC? --     Most recent vital signs: Vitals:   10/11/22 2317 10/11/22 2327  BP: (!) 85/69 91/76  Pulse: (!) 42 (!) 57  Resp: (!) 31 (!) 24  Temp:    SpO2: 99% 100%    Constitutional: Somnolent but arousable to voice. Eyes: Conjunctivae are normal. Head: Atraumatic. Nose: No congestion/rhinnorhea. Mouth/Throat: Mucous membranes are moist.  Cardiovascular: Tachycardic, irregularly irregular rhythm. Grossly normal heart sounds.  2+ radial pulses bilaterally. Respiratory: Normal respiratory effort.  No retractions. Lungs CTAB. Gastrointestinal: Soft and nontender. No distention.  Rectal exam with maroon guaiac positive  stool. Musculoskeletal: No lower extremity tenderness nor edema.  Neurologic:  Normal speech and language. No gross focal neurologic deficits are appreciated.    ED Results / Procedures / Treatments   Labs (all labs ordered are listed, but only abnormal results are displayed) Labs Reviewed  COMPREHENSIVE METABOLIC PANEL - Abnormal; Notable for the following components:      Result Value   Potassium 2.9 (*)    Chloride 84 (*)    CO2 39 (*)    Glucose, Bld 111 (*)    BUN 44 (*)    Creatinine, Ser 1.01 (*)    Calcium 8.3 (*)    Albumin 3.1 (*)    Total Bilirubin 1.5 (*)    GFR, Estimated 53 (*)    All other components within normal limits  CBC - Abnormal; Notable for the following components:   MCV 105.1 (*)    All other components within normal limits  URINALYSIS, ROUTINE W REFLEX MICROSCOPIC - Abnormal; Notable for the following components:   Color, Urine YELLOW (*)    APPearance CLEAR (*)    Glucose, UA 150 (*)    Leukocytes,Ua TRACE (*)    Bacteria, UA RARE (*)    All other  components within normal limits  URINALYSIS, COMPLETE (UACMP) WITH MICROSCOPIC - Abnormal; Notable for the following components:   Color, Urine YELLOW (*)    APPearance CLEAR (*)    Glucose, UA 150 (*)    Bacteria, UA RARE (*)    All other components within normal limits  PROTIME-INR  MAGNESIUM  HEMOGLOBIN AND HEMATOCRIT, BLOOD  CREATININE, URINE, RANDOM  SODIUM, URINE, RANDOM  THYROID PANEL WITH TSH  CBC  BASIC METABOLIC PANEL  APTT  CBC  POTASSIUM  POC OCCULT BLOOD, ED  TYPE AND SCREEN  TYPE AND SCREEN  TROPONIN I (HIGH SENSITIVITY)     EKG  ED ECG REPORT I, Chesley Noon, the attending physician, personally viewed and interpreted this ECG.   Date: 10/11/2022  EKG Time: 20:44  Rate: 121  Rhythm: atrial fibrillation, frequent PVC's noted  Axis: Normal  Intervals:none  ST&T Change: None  RADIOLOGY CT head reviewed and interpreted by me with no hemorrhage or midline  shift.  PROCEDURES:  Critical Care performed: No  Procedures   MEDICATIONS ORDERED IN ED: Medications  potassium chloride 10 mEq in 100 mL IVPB (10 mEq Intravenous New Bag/Given 10/11/22 2340)  pantoprazole (PROTONIX) injection 40 mg (has no administration in time range)  lactated ringers infusion (has no administration in time range)  acetaminophen (TYLENOL) tablet 650 mg (has no administration in time range)    Or  acetaminophen (TYLENOL) suppository 650 mg (has no administration in time range)  sodium chloride flush (NS) 0.9 % injection 3 mL (3 mLs Intravenous Given 10/11/22 2338)  lactated ringers bolus 1,000 mL (0 mLs Intravenous Stopped 10/11/22 2216)  pantoprazole (PROTONIX) injection 40 mg (40 mg Intravenous Given 10/11/22 2147)  metoprolol tartrate (LOPRESSOR) injection 5 mg (5 mg Intravenous Given 10/11/22 2240)  lactated ringers bolus 1,000 mL (1,000 mLs Intravenous New Bag/Given 10/11/22 2301)  potassium chloride (KLOR-CON) packet 40 mEq (40 mEq Oral Given 10/11/22 2337)     IMPRESSION / MDM / ASSESSMENT AND PLAN / ED COURSE  I reviewed the triage vital signs and the nursing notes.                              89 y.o. female with past medical history of dementia, COPD, CHF, and chronic hypoxic respiratory failure on 3 L nasal cannula who presents to the ED for generalized weakness and rectal bleeding.  Patient's presentation is most consistent with acute presentation with potential threat to life or bodily function.  Differential diagnosis includes, but is not limited to, anemia, electrolyte abnormality, AKI, arrhythmia, ACS, GI bleed, UTI, intracranial process.  Patient chronically ill-appearing but in no acute distress, vital signs remarkable for tachycardia and borderline hypotension.  Patient is somnolent but easily arousable to voice, does not have any focal neurologic deficits.  CT head and cervical spine performed given her fall, are negative for acute traumatic injury or  other acute process.  EKG shows atrial fibrillation with RVR, no ischemic changes noted.  While family reported that patient takes a blood thinner, I do not see any record of this in her provided paperwork, and she does not seem to have any history of atrial fibrillation.  She was given IV fluid bolus with improvement in blood pressure, follow-up H&H is stable.  Additional labs are remarkable for hypokalemia with no significant AKI.  She does have elevated BUN to creatinine ratio and we will give IV Protonix due to possible upper GI bleed.  No significant anemia or leukocytosis noted.  Urinalysis shows no signs of infection.  Patient given IV metoprolol for rate control and case discussed with hospitalist for admission.      FINAL CLINICAL IMPRESSION(S) / ED DIAGNOSES   Final diagnoses:  Atrial fibrillation, unspecified type (HCC)  Generalized weakness  Acute GI bleeding     Rx / DC Orders   ED Discharge Orders     None        Note:  This document was prepared using Dragon voice recognition software and may include unintentional dictation errors.   Chesley Noon, MD 10/12/22 [phone]

## 2022-10-11 NOTE — ED Triage Notes (Signed)
Pt also complaining of right knee pain.

## 2022-10-12 ENCOUNTER — Inpatient Hospital Stay: Payer: Medicare (Managed Care)

## 2022-10-12 ENCOUNTER — Encounter: Payer: Self-pay | Admitting: Internal Medicine

## 2022-10-12 ENCOUNTER — Inpatient Hospital Stay (HOSPITAL_COMMUNITY)
Admit: 2022-10-12 | Discharge: 2022-10-12 | Disposition: A | Discharge status: Home / Self Care | Payer: Medicare (Managed Care) | Attending: Internal Medicine | Admitting: Internal Medicine

## 2022-10-12 DIAGNOSIS — N179 Acute kidney failure, unspecified: Secondary | ICD-10-CM | POA: Diagnosis not present

## 2022-10-12 DIAGNOSIS — I4891 Unspecified atrial fibrillation: Secondary | ICD-10-CM

## 2022-10-12 DIAGNOSIS — G4733 Obstructive sleep apnea (adult) (pediatric): Secondary | ICD-10-CM

## 2022-10-12 DIAGNOSIS — E876 Hypokalemia: Secondary | ICD-10-CM | POA: Diagnosis not present

## 2022-10-12 DIAGNOSIS — K922 Gastrointestinal hemorrhage, unspecified: Secondary | ICD-10-CM | POA: Diagnosis not present

## 2022-10-12 LAB — CBG MONITORING, ED
Glucose-Capillary: 92 mg/dL (ref 70–99)
Glucose-Capillary: 90 mg/dL (ref 70–99)
Glucose-Capillary: 128 mg/dL — ABNORMAL HIGH (ref 70–99)
Glucose-Capillary: 119 mg/dL — ABNORMAL HIGH (ref 70–99)

## 2022-10-12 LAB — CBC
HCT: 40.4 % (ref 36.0–46.0)
Hemoglobin: 12.5 g/dL (ref 12.0–15.0)
MCH: 33.3 pg (ref 26.0–34.0)
MCHC: 30.9 g/dL (ref 30.0–36.0)
MCV: 107.7 fL — ABNORMAL HIGH (ref 80.0–100.0)
Platelets: 174 10*3/uL (ref 150–400)
RBC: 3.75 MIL/uL — ABNORMAL LOW (ref 3.87–5.11)
RDW: 13.6 % (ref 11.5–15.5)
WBC: 9.2 10*3/uL (ref 4.0–10.5)
nRBC: 0 % (ref 0.0–0.2)

## 2022-10-12 LAB — BASIC METABOLIC PANEL
Anion gap: 7 (ref 5–15)
BUN: 39 mg/dL — ABNORMAL HIGH (ref 8–23)
CO2: 38 mmol/L — ABNORMAL HIGH (ref 22–32)
Calcium: 7.4 mg/dL — ABNORMAL LOW (ref 8.9–10.3)
Chloride: 91 mmol/L — ABNORMAL LOW (ref 98–111)
Creatinine, Ser: 0.79 mg/dL (ref 0.44–1.00)
GFR, Estimated: >60 mL/min (ref >60)
Glucose, Bld: 104 mg/dL — ABNORMAL HIGH (ref 70–99)
Potassium: 3.2 mmol/L — ABNORMAL LOW (ref 3.5–5.1)
Sodium: 136 mmol/L (ref 135–145)

## 2022-10-12 LAB — ECHOCARDIOGRAM COMPLETE
AR max vel: 1.8 cm2
AV Area VTI: 1.43 cm2
AV Area mean vel: 1.78 cm2
AV Mean grad: 3.5 mmHg
AV Peak grad: 7 mmHg
Ao pk vel: 1.33 m/s
Area-P 1/2: 3.76 cm2
MV VTI: 1 cm2
S' Lateral: 2.2 cm

## 2022-10-12 LAB — TROPONIN I (HIGH SENSITIVITY): Troponin I (High Sensitivity): 48 ng/L — ABNORMAL HIGH (ref <18)

## 2022-10-12 LAB — POTASSIUM: Potassium: 3.9 mmol/L (ref 3.5–5.1)

## 2022-10-12 LAB — APTT: aPTT: 31 seconds (ref 24–36)

## 2022-10-12 LAB — GLUCOSE, CAPILLARY
Glucose-Capillary: 167 mg/dL — ABNORMAL HIGH (ref 70–99)
Glucose-Capillary: 124 mg/dL — ABNORMAL HIGH (ref 70–99)

## 2022-10-12 MED ORDER — DILTIAZEM HCL 25 MG/5ML IV SOLN
10.0000 mg | Freq: Four times a day (QID) | INTRAVENOUS | Status: DC | PRN
  Administered 2022-10-12: 10 mg via INTRAVENOUS
  Filled 2022-10-12: qty 5

## 2022-10-12 MED ORDER — POTASSIUM CHLORIDE CRYS ER 20 MEQ PO TBCR
40.0000 meq | EXTENDED_RELEASE_TABLET | Freq: Two times a day (BID) | ORAL | Status: AC
  Administered 2022-10-12 (×2): 40 meq via ORAL
  Filled 2022-10-12 (×2): qty 2

## 2022-10-12 MED ORDER — MORPHINE SULFATE (PF) 2 MG/ML IV SOLN
1.0000 mg | Freq: Four times a day (QID) | INTRAVENOUS | Status: DC | PRN
  Administered 2022-10-12 (×2): 1 mg via INTRAVENOUS
  Filled 2022-10-12 (×2): qty 1

## 2022-10-12 MED ORDER — LACTATED RINGERS IV SOLN: INTRAVENOUS | Status: DC

## 2022-10-12 MED ORDER — AMIODARONE HCL IN DEXTROSE 360-4.14 MG/200ML-% IV SOLN
60.0000 mg/h | INTRAVENOUS | Status: AC
  Administered 2022-10-12 (×2): 60 mg/h via INTRAVENOUS
  Filled 2022-10-12 (×2): qty 200

## 2022-10-12 MED ORDER — IOHEXOL 300 MG/ML  SOLN
100.0000 mL | Freq: Once | INTRAMUSCULAR | Status: AC | PRN
  Administered 2022-10-12: 100 mL via INTRAVENOUS

## 2022-10-12 MED ORDER — INSULIN ASPART 100 UNIT/ML IJ SOLN
0.0000 [IU] | Freq: Three times a day (TID) | INTRAMUSCULAR | Status: DC
  Administered 2022-10-12 – 2022-10-14 (×2): 2 [IU] via SUBCUTANEOUS
  Filled 2022-10-12 (×2): qty 1

## 2022-10-12 MED ORDER — FLUTICASONE FUROATE-VILANTEROL 100-25 MCG/ACT IN AEPB
1.0000 | INHALATION_SPRAY | Freq: Every day | RESPIRATORY_TRACT | Status: DC
  Administered 2022-10-14 – 2022-10-15 (×2): 1 via RESPIRATORY_TRACT
  Filled 2022-10-12 (×2): qty 28

## 2022-10-12 MED ORDER — METOPROLOL TARTRATE 25 MG PO TABS
12.5000 mg | ORAL_TABLET | Freq: Two times a day (BID) | ORAL | Status: DC
  Administered 2022-10-12 – 2022-10-13 (×3): 12.5 mg via ORAL
  Filled 2022-10-12 (×4): qty 1

## 2022-10-12 MED ORDER — AMIODARONE LOAD VIA INFUSION
150.0000 mg | Freq: Once | INTRAVENOUS | Status: AC
  Administered 2022-10-12: 150 mg via INTRAVENOUS
  Filled 2022-10-12: qty 83.34

## 2022-10-12 MED ORDER — VITAMIN D 25 MCG (1000 UNIT) PO TABS
500.0000 [IU] | ORAL_TABLET | Freq: Every day | ORAL | Status: DC
  Administered 2022-10-12 – 2022-10-15 (×4): 500 [IU] via ORAL
  Filled 2022-10-12 (×4): qty 1

## 2022-10-12 MED ORDER — NAPHAZOLINE-GLYCERIN 0.012-0.25 % OP SOLN: 1.0000 [drp] | Freq: Four times a day (QID) | OPHTHALMIC | Status: DC | PRN

## 2022-10-12 MED ORDER — AMIODARONE HCL IN DEXTROSE 360-4.14 MG/200ML-% IV SOLN
30.0000 mg/h | INTRAVENOUS | Status: DC
  Administered 2022-10-12 – 2022-10-13 (×3): 30 mg/h via INTRAVENOUS
  Filled 2022-10-12 (×3): qty 200

## 2022-10-12 MED ORDER — SODIUM CHLORIDE 0.9 % IV SOLN: INTRAVENOUS | Status: AC

## 2022-10-12 MED ORDER — SODIUM CHLORIDE 0.9 % IV SOLN
3.0000 g | Freq: Four times a day (QID) | INTRAVENOUS | Status: DC
  Administered 2022-10-12 – 2022-10-14 (×8): 3 g via INTRAVENOUS
  Filled 2022-10-12 (×9): qty 8

## 2022-10-12 MED ORDER — DILTIAZEM HCL 25 MG/5ML IV SOLN
5.0000 mg | Freq: Once | INTRAVENOUS | Status: AC
  Administered 2022-10-12: 5 mg via INTRAVENOUS
  Filled 2022-10-12: qty 5

## 2022-10-12 MED ORDER — INSULIN ASPART 100 UNIT/ML IJ SOLN: 0.0000 [IU] | Freq: Every day | INTRAMUSCULAR | Status: DC

## 2022-10-12 MED ORDER — ALBUTEROL SULFATE (2.5 MG/3ML) 0.083% IN NEBU: 2.5000 mg | INHALATION_SOLUTION | Freq: Four times a day (QID) | RESPIRATORY_TRACT | Status: DC | PRN

## 2022-10-12 NOTE — ED Notes (Signed)
Pt positioned to her left side for comfort.

## 2022-10-12 NOTE — TOC Initial Note (Signed)
Transition of Care [facility]) - Initial/Assessment Note    Patient Details  Name: Sheena Long MRN: [id number] Date of Birth: 1932-01-11  [facility] CM/SW Contact:    Darolyn Rua, LCSW Phone Number: 10/12/2022, 12:58 PM  Clinical Narrative:                  CSW talked with [name] with [facility] [phone]) who reports patient is from home alone, family support [name], nephew point of contact). Patient does have a son but he has a stroke hx and unable to provide care. Baseline is transfers with assistance, gets home aid services coming twice a day, daily. Wears o2 at baseline, recent decline.   PT/OT orders requested from MD pending evals at this time for recommendations. Please updated [name] with [name] regarding dispo plan.    Expected Discharge Plan: Home w Home Health Services     Patient Goals and CMS Choice            Expected Discharge Plan and Services                                              Prior Living Arrangements/Services                       Activities of Daily Living Home Assistive Devices/Equipment: Eyeglasses, Dan Humphreys (specify type), Wheelchair ADL Screening (condition at time of admission) Patient's cognitive ability adequate to safely complete daily activities?: Yes Is the patient deaf or have difficulty hearing?: No Does the patient have difficulty seeing, even when wearing glasses/contacts?: No Does the patient have difficulty concentrating, remembering, or making decisions?: No Patient able to express need for assistance with ADLs?: Yes Does the patient have difficulty dressing or bathing?: Yes Independently performs ADLs?: No Communication: Independent Dressing (OT): Independent Grooming: Independent Feeding: Independent Bathing: Needs assistance Is this a change from baseline?: Pre-admission baseline Toileting: Needs assistance Is this a change from baseline?: Pre-admission baseline In/Out Bed: Needs  assistance Is this a change from baseline?: Pre-admission baseline Walks in Home: Needs assistance Is this a change from baseline?: Pre-admission baseline Does the patient have difficulty walking or climbing stairs?: Yes Weakness of Legs: Both Weakness of Arms/Hands: None  Permission Sought/Granted                  Emotional Assessment              Admission diagnosis:  GI bleeding [K92.2] Patient Active Problem List   Diagnosis Date Noted   Atrial fibrillation with RVR (HCC) 10/11/2022   GI bleeding 10/11/2022   Hypokalemia 10/11/2022   AKI (acute kidney injury) (HCC) 10/11/2022   Sleep apnea, obstructive 04/22/2014   PCP:  Precious Reel, NP (Inactive) Pharmacy:  No Pharmacies Listed    Social Determinants of Health (SDOH) Social History: SDOH Screenings   Food Insecurity: No Food Insecurity (10/12/2022)  Housing: Low Risk  (10/12/2022)  Transportation Needs: No Transportation Needs (10/12/2022)  Utilities: Not At Risk (10/12/2022)  Tobacco Use: Medium Risk (10/12/2022)   SDOH Interventions:     Readmission Risk Interventions     No data to display

## 2022-10-12 NOTE — Progress Notes (Signed)
[facility]  - [facility]   PATIENT NAME: Jeweldean Furmanski    MR#:  [id number]  DATE OF BIRTH:  [birth year]  SUBJECTIVE:   Nurse [name] at bedside. Patient is a poor historian. Unable to give much history. Tearful wants to go home. Found to be in a fib with RV and some history of blood in stool   VITALS:  Blood pressure 93/64, pulse (!) 102, temperature 97.8 F (36.6 C), temperature source Oral, resp. rate 20, SpO2 90 %.  PHYSICAL EXAMINATION:   GENERAL:  89 y.o.-year-old patient with no acute distress. obese LUNGS: Normal breath sounds bilaterally, no wheezing CARDIOVASCULAR: S1, S2 normal. No murmur irregularly regular ABDOMEN: Soft, nontender, nondistended. Bowel sounds present.  EXTREMITIES: No  edema b/l.    NEUROLOGIC: nonfocal  patient is alert and awake, dementia at baseline  LABORATORY PANEL:  CBC Recent Labs  Lab 10/12/22 0529  WBC 9.2  HGB 12.5  HCT 40.4  PLT 174    Chemistries  Recent Labs  Lab 10/11/22 1853 10/11/22 2217 10/12/22 0107 10/12/22 0529  NA 137  --   --  136  K 2.9*  --    < > 3.2*  CL 84*  --   --  91*  CO2 39*  --   --  38*  GLUCOSE 111*  --   --  104*  BUN 44*  --   --  39*  CREATININE 1.01*  --   --  0.79  CALCIUM 8.3*  --   --  7.4*  MG  --  2.0  --   --   AST 38  --   --   --   ALT 11  --   --   --   ALKPHOS 47  --   --   --   BILITOT 1.5*  --   --   --    < > = values in this interval not displayed.   Cardiac Enzymes No results for input(s): "TROPONINI" in the last 168 hours. RADIOLOGY:  CT ABDOMEN PELVIS W CONTRAST  Result Date: 10/12/2022 CLINICAL DATA:  Ulcerative colitis. History of new onset rectal bleeding. EXAM: CT ABDOMEN AND PELVIS WITH CONTRAST TECHNIQUE: Multidetector CT imaging of the abdomen and pelvis was performed using the standard protocol following bolus administration of intravenous contrast. RADIATION DOSE REDUCTION: This exam was performed according to the departmental  dose-optimization program which includes automated exposure control, adjustment of the mA and/or kV according to patient size and/or use of iterative reconstruction technique. CONTRAST:  OMNIPAQUE IOHEXOL 300 MG/ML  SOLN COMPARISON:  CT 09/18/2022 FINDINGS: Lower chest: Heart is enlarged. Coronary artery calcifications are seen. There are calcifications along the mitral valve annulus. There is some linear opacity lung bases likely scar or atelectasis. No pleural effusion. Hepatobiliary: Gallbladder is nondilated but has a numerous luminal stones. Slight nodular contours of the liver. Patent portal vein. Once again there are several cystic areas in the liver which are quite small and too small to completely characterize. Based on prior report recommend continued follow up evaluation when appropriate such as MRI or short-term CT follow-up in 3-6 months. Pancreas: Unremarkable. No pancreatic ductal dilatation or surrounding inflammatory changes. Spleen: Stable small splenic cystic lesion. No specific imaging follow-up. Adrenals/Urinary Tract: Right adrenal gland is preserved. Slight thickening of the left adrenal gland. Nonspecific and unchanged. 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 No specific imaging follow up, unchanged. No collecting system dilatation. The ureters  have normal course and caliber extending down to the bladder. Preserved contours of the urinary bladder. Stomach/Bowel: Air-fluid level along the stomach. No oral contrast. Small bowel is nondilated. Extensive colonic diverticula identified. There is wall thickening with stranding along the distal descending colon. This was seen on the prior examination and appears similar. This could be an area of diverticulitis. Please correlate with the history. No associated obstruction, free air or rim enhancing fluid collections. Vascular/Lymphatic: Moderate vascular calcifications are identified. Normal caliber aorta and IVC. No specific abnormal  lymph node enlargement identified in the abdomen and pelvis. Reproductive: Status post hysterectomy. No adnexal masses. Other: Small fat containing left inguinal hernia. Musculoskeletal: Advanced degenerative changes of the spine and pelvis. Multilevel stenosis along the spine with bony fusion of the disc space of L2-3. Particular advanced degenerative changes of the right hip as well. Global fatty muscle atrophy. IMPRESSION: Colonic diverticulosis. Once again there is an area of wall thickening with stranding along the descending colon consistent with presumed area of diverticulitis. Recommend follow-up to confirm clearance and exclude secondary pathology. No associated obstruction, free air or fluid collections. Gallstones. As described previously there are multiple low-attenuation cystic lesions in the liver, too small to completely characterize. Probable benign cystic lesions but in principle are indeterminate as per the prior examination recommend continued follow-up with either short-term follow up CT scan in 3-6 months or MRI to further delineate as clinically appropriate. Electronically Signed   By: Karen Kays M.D.   On: 10/12/2022 10:33   DG Chest Portable 1 View  Result Date: 10/11/2022 CLINICAL DATA:  Weakness EXAM: PORTABLE CHEST 1 VIEW COMPARISON:  07/28/2022 FINDINGS: Cardiac shadow is enlarged but stable. Aortic calcifications are again seen. Lungs are well aerated bilaterally. Mild left basilar scarring is noted. No acute abnormality noted. IMPRESSION: Mild left basilar scarring Electronically Signed   By: Alcide Clever M.D.   On: 10/11/2022 21:28   CT Head Wo Contrast  Result Date: 10/11/2022 CLINICAL DATA:  Recent fall with headaches and neck pain, initial encounter EXAM: CT HEAD WITHOUT CONTRAST CT CERVICAL SPINE WITHOUT CONTRAST TECHNIQUE: Multidetector CT imaging of the head and cervical spine was performed following the standard protocol without intravenous contrast. Multiplanar CT image  reconstructions of the cervical spine were also generated. RADIATION DOSE REDUCTION: This exam was performed according to the departmental dose-optimization program which includes automated exposure control, adjustment of the mA and/or kV according to patient size and/or use of iterative reconstruction technique. COMPARISON:  07/17/2022 FINDINGS: CT HEAD FINDINGS Brain: No evidence of acute infarction, hemorrhage, hydrocephalus, extra-axial collection or mass lesion/mass effect. Mild chronic white matter ischemic changes are seen. Vascular: No hyperdense vessel or unexpected calcification. Skull: Normal. Negative for fracture or focal lesion. Sinuses/Orbits: No acute finding. Other: None. CT CERVICAL SPINE FINDINGS Alignment: Mild loss of the normal cervical lordosis is noted. Skull base and vertebrae: 7 cervical segments are well visualized. Multilevel osteophytic change and facet hypertrophic changes are noted. No acute fracture or acute facet abnormality is noted. The odontoid is within normal limits. Soft tissues and spinal canal: Surrounding soft tissue structures are within normal limits. Heavy atherosclerotic calcifications are seen. Upper chest: Visualized lung apices are within normal limits. Other: None IMPRESSION: CT of the head: No acute intracranial abnormality noted. CT of the cervical spine: Multilevel degenerative change without acute abnormality. Electronically Signed   By: Alcide Clever M.D.   On: 10/11/2022 21:25   CT Cervical Spine Wo Contrast  Result Date: 10/11/2022 CLINICAL DATA:  Recent fall with headaches and neck pain, initial encounter EXAM: CT HEAD WITHOUT CONTRAST CT CERVICAL SPINE WITHOUT CONTRAST TECHNIQUE: Multidetector CT imaging of the head and cervical spine was performed following the standard protocol without intravenous contrast. Multiplanar CT image reconstructions of the cervical spine were also generated. RADIATION DOSE REDUCTION: This exam was performed according to the  departmental dose-optimization program which includes automated exposure control, adjustment of the mA and/or kV according to patient size and/or use of iterative reconstruction technique. COMPARISON:  07/17/2022 FINDINGS: CT HEAD FINDINGS Brain: No evidence of acute infarction, hemorrhage, hydrocephalus, extra-axial collection or mass lesion/mass effect. Mild chronic white matter ischemic changes are seen. Vascular: No hyperdense vessel or unexpected calcification. Skull: Normal. Negative for fracture or focal lesion. Sinuses/Orbits: No acute finding. Other: None. CT CERVICAL SPINE FINDINGS Alignment: Mild loss of the normal cervical lordosis is noted. Skull base and vertebrae: 7 cervical segments are well visualized. Multilevel osteophytic change and facet hypertrophic changes are noted. No acute fracture or acute facet abnormality is noted. The odontoid is within normal limits. Soft tissues and spinal canal: Surrounding soft tissue structures are within normal limits. Heavy atherosclerotic calcifications are seen. Upper chest: Visualized lung apices are within normal limits. Other: None IMPRESSION: CT of the head: No acute intracranial abnormality noted. CT of the cervical spine: Multilevel degenerative change without acute abnormality. Electronically Signed   By: Alcide Clever M.D.   On: 10/11/2022 21:25    Assessment and Plan JO BOLHUIS is a 89 y.o. female with past medical history of dementia, COPD, CHF, and chronic hypoxic respiratory failure on 3 L nasal cannula who presents to the ED complaining of rectal bleeding.  Family reports that patient has been increasingly weak over the course of the day today and had a fall earlier today where she struck her head.  Her home health nurse then noticed that she had blood in her stool just prior to arrival   Patient is a poor historian unable to give any history review system. The only thing she tells me is her "boo hoo hurts"  Patient's POA Cristie Hem is  not able to give any additional information other than patient's blood pressure was low yesterday and other family member noticed some blood in her stool.  Abdominal pain with G.I. bleed -- etiology remains unclear how were does have history of colonic diverticulosis. This could very well be diverticular bleeding. -- Discussed with G.I. Dr. Mia Creek recommend CT of the abdomen that shows persistent distal colonic diverticulitis and extensive diverticulosis. -- Will start patient empirically on IV unasyn -- await further G.I. recommendations -- hemoglobin stable at 12.5 -- monitor H&H  A fib with RVR new onset Hypotension -- patient was noted to have a fib with RVR 115--140's -- currently on IV fluids. Gave dose of IV Cardizem 10 mg. Blood pressure dropped. -- Patient has history of CHF diastolic and on chronic oxygen per old record -- will continue IV fluids and start patient on IV amiodarone drip. -- Cardiology consultation with [facility].. Dr End aware -- echo of the heart done results pending  Hypokalemia -- replete  AKI--pre-renal --IVF --creat stable  History of dementia/cognitive decline per  records -- patient unable to give accurate history and has memory issues  [facility] I spoke with Dr. Hillery Hunter and updated her  PT OT to see patient     Procedures: Family communication : Cristie Hem POA on the phone Consults :  G.I., cardiology CODE STATUS: DNR DNI-- carries out of facility DVT Prophylaxis :SCD (gi bleed) Level of care: Progressive Status is: Inpatient Remains inpatient appropriate because: gi bleed, Afib    TOTAL TIME TAKING CARE OF THIS PATIENT: 35 minutes.  >50% time spent on counselling and coordination of care  Note: This dictation was prepared with Dragon dictation along with smaller phrase technology. Any transcriptional errors that result from this process are unintentional.  Enedina Finner M.D    [facility]   CC: Primary care physician; Precious Reel, NP (Inactive)

## 2022-10-12 NOTE — ED Notes (Signed)
Pt noted to be sustaining in the 130-150's. Spoke with Dr. Arville Care and 5mg  Cardizem ordered and given.

## 2022-10-12 NOTE — ED Notes (Signed)
ED TO INPATIENT HANDOFF REPORT  ED Nurse Name and Phone #: Delice Bison, RN  S Name/Age/Gender Sheena Long 89 y.o. female Room/Bed: ED10A/ED10A  Code Status   Code Status: DNR  Home/SNF/Other Home Patient oriented to: self and situation Is this baseline? Yes   Triage Complete: Triage complete  Chief Complaint GI bleeding [K92.2]  Triage Note Patient to ED via [facility] from home for rectal bleeding. Started approx 1 hr- seen by home health RN. Family unsure of what it looked like.   Pt also complaining of right knee pain.   Allergies No Known Allergies  Level of Care/Admitting Diagnosis ED Disposition     ED Disposition  Admit   Condition  --   Comment  Hospital Area: [facility] [[id number]]  Level of Care: Progressive [102]  Admit to Progressive based on following criteria: GI, ENDOCRINE disease patients with GI bleeding, acute liver failure or pancreatitis, stable with diabetic ketoacidosis or thyrotoxicosis (hypothyroid) state.  Covid Evaluation: Asymptomatic - no recent exposure (last 10 days) testing not required  Diagnosis: GI bleeding [161096]  Admitting Physician: Nolberto Hanlon [[id number]]  Attending Physician: Nolberto Hanlon [[id number]]  Certification:: I certify this patient will need inpatient services for at least 2 midnights  Estimated Length of Stay: 3          B Medical/Surgery History Past Medical History:  Diagnosis Date   CHF (congestive heart failure) (HCC)    On home oxygen therapy    3l/ [place]   History reviewed. No pertinent surgical history.   A IV Location/Drains/Wounds Patient Lines/Drains/Airways Status     Active Line/Drains/Airways     Name Placement date Placement time Site Days   Peripheral IV 10/11/22 20 G Right Forearm 10/11/22  2057  Forearm  1   Peripheral IV 10/11/22 20 G Anterior;Left Forearm 10/11/22  2230  Forearm  1            Intake/Output Last 24 hours  Intake/Output Summary (Last 24 hours) at  10/12/2022 1558 Last data filed at 10/12/2022 0227 Gross per 24 hour  Intake 2400 ml  Output --  Net 2400 ml    Labs/Imaging Results for orders placed or performed during the hospital encounter of 10/11/22 (from the past 48 hour(s))  Comprehensive metabolic panel     Status: Abnormal   Collection Time: 10/11/22  6:53 PM  Result Value Ref Range   Sodium 137 135 - 145 mmol/L   Potassium 2.9 (L) 3.5 - 5.1 mmol/L   Chloride 84 (L) 98 - 111 mmol/L   CO2 39 (H) 22 - 32 mmol/L   Glucose, Bld 111 (H) 70 - 99 mg/dL    Comment: Glucose reference range applies only to samples taken after fasting for at least 8 hours.   BUN 44 (H) 8 - 23 mg/dL   Creatinine, Ser 2.95 (H) 0.44 - 1.00 mg/dL   Calcium 8.3 (L) 8.9 - 10.3 mg/dL   Total Protein 6.9 6.5 - 8.1 g/dL   Albumin 3.1 (L) 3.5 - 5.0 g/dL   AST 38 15 - 41 U/L   ALT 11 0 - 44 U/L   Alkaline Phosphatase 47 38 - 126 U/L   Total Bilirubin 1.5 (H) 0.3 - 1.2 mg/dL   GFR, Estimated 53 (L) >60 mL/min    Comment: (NOTE) Calculated using the CKD-EPI Creatinine Equation (2021)    Anion gap 14 5 - 15    Comment: Performed at [facility], [address], [place], [place]  [address]  CBC     Status: Abnormal   Collection Time: 10/11/22  6:53 PM  Result Value Ref Range   WBC 9.2 4.0 - 10.5 K/uL   RBC 3.90 3.87 - 5.11 MIL/uL   Hemoglobin 13.0 12.0 - 15.0 g/dL   HCT 60.4 54.0 - 98.1 %   MCV 105.1 (H) 80.0 - 100.0 fL   MCH 33.3 26.0 - 34.0 pg   MCHC 31.7 30.0 - 36.0 g/dL   RDW 19.1 47.8 - 29.5 %   Platelets 178 150 - 400 K/uL   nRBC 0.0 0.0 - 0.2 %    Comment: Performed at [facility], [address] [place], [place] [address]  Protime-INR     Status: None   Collection Time: 10/11/22  8:56 PM  Result Value Ref Range   Prothrombin Time 15.2 11.4 - 15.2 seconds   INR 1.2 0.8 - 1.2    Comment: (NOTE) INR goal varies based on device and disease states. Performed at [facility], [address] [place], [place]  [address]   Urinalysis, Routine w reflex microscopic -Urine, Clean Catch     Status: Abnormal   Collection Time: 10/11/22 10:05 PM  Result Value Ref Range   Color, Urine YELLOW (A) YELLOW   APPearance CLEAR (A) CLEAR   Specific Gravity, Urine 1.013 1.005 - 1.030   pH 6.0 5.0 - 8.0   Glucose, UA 150 (A) NEGATIVE mg/dL   Hgb urine dipstick NEGATIVE NEGATIVE   Bilirubin Urine NEGATIVE NEGATIVE   Ketones, ur NEGATIVE NEGATIVE mg/dL   Protein, ur NEGATIVE NEGATIVE mg/dL   Nitrite NEGATIVE NEGATIVE   Leukocytes,Ua TRACE (A) NEGATIVE   RBC / HPF 0-5 0 - 5 RBC/hpf   WBC, UA 0-5 0 - 5 WBC/hpf   Bacteria, UA RARE (A) NONE SEEN   Squamous Epithelial / HPF 0-5 0 - 5 /HPF   Mucus PRESENT    Hyaline Casts, UA PRESENT     Comment: Performed at [facility], [address] [place], [place] [address]  Creatinine, urine, random     Status: None   Collection Time: 10/11/22 10:05 PM  Result Value Ref Range   Creatinine, Urine 64 mg/dL    Comment: Performed at [facility], [address] [place], [place] [address]  Sodium, urine, random     Status: None   Collection Time: 10/11/22 10:05 PM  Result Value Ref Range   Sodium, Ur 39 mmol/L    Comment: Performed at [facility], [address] [place], [place] [address]  Urinalysis, Complete w Microscopic -Urine, Catheterized     Status: Abnormal   Collection Time: 10/11/22 10:05 PM  Result Value Ref Range   Color, Urine YELLOW (A) YELLOW   APPearance CLEAR (A) CLEAR   Specific Gravity, Urine 1.013 1.005 - 1.030   pH 6.0 5.0 - 8.0   Glucose, UA 150 (A) NEGATIVE mg/dL   Hgb urine dipstick NEGATIVE NEGATIVE   Bilirubin Urine NEGATIVE NEGATIVE   Ketones, ur NEGATIVE NEGATIVE mg/dL   Protein, ur NEGATIVE NEGATIVE mg/dL   Nitrite NEGATIVE NEGATIVE   Leukocytes,Ua NEGATIVE NEGATIVE   WBC, UA 0-5 0 - 5 WBC/hpf   Bacteria, UA RARE (A) NONE SEEN   Squamous Epithelial / HPF 0-5 0 - 5 /HPF   Mucus PRESENT    Hyaline Casts,  UA PRESENT     Comment: Performed at [facility], [address], [place], [place] [address]  Magnesium  Status: None   Collection Time: 10/11/22 10:17 PM  Result Value Ref Range   Magnesium 2.0 1.7 - 2.4 mg/dL    Comment: Performed at [facility], [address] [place], [place] [address]  Hemoglobin and hematocrit, blood     Status: None   Collection Time: 10/11/22 10:17 PM  Result Value Ref Range   Hemoglobin 13.1 12.0 - 15.0 g/dL   HCT 60.4 54.0 - 98.1 %    Comment: Performed at [facility], [address] [place], [place] [address]  CBG monitoring, ED     Status: None   Collection Time: 10/12/22  1:03 AM  Result Value Ref Range   Glucose-Capillary 92 70 - 99 mg/dL    Comment: Glucose reference range applies only to samples taken after fasting for at least 8 hours.  APTT     Status: None   Collection Time: 10/12/22  1:07 AM  Result Value Ref Range   aPTT 31 24 - 36 seconds    Comment: Performed at [facility], [address] [place], [place] [address]  Troponin I (High Sensitivity)     Status: Abnormal   Collection Time: 10/12/22  1:07 AM  Result Value Ref Range   Troponin I (High Sensitivity) 48 (H) <18 ng/L    Comment: (NOTE) Elevated high sensitivity troponin I (hsTnI) values and significant  changes across serial measurements may suggest ACS but many other  chronic and acute conditions are known to elevate hsTnI results.  Refer to the "Links" section for chest pain algorithms and additional  guidance. Performed at [facility], [address] [place], [place] [address]   Potassium     Status: None   Collection Time: 10/12/22  1:07 AM  Result Value Ref Range   Potassium 3.9 3.5 - 5.1 mmol/L    Comment: Performed at [facility], [address] [place], [place] [address]  CBC     Status: Abnormal   Collection Time: 10/12/22  5:29 AM  Result Value Ref Range   WBC 9.2 4.0 - 10.5 K/uL   RBC 3.75  (L) 3.87 - 5.11 MIL/uL   Hemoglobin 12.5 12.0 - 15.0 g/dL   HCT 69.6 29.5 - 28.4 %   MCV 107.7 (H) 80.0 - 100.0 fL   MCH 33.3 26.0 - 34.0 pg   MCHC 30.9 30.0 - 36.0 g/dL   RDW 13.2 44.0 - 10.2 %   Platelets 174 150 - 400 K/uL   nRBC 0.0 0.0 - 0.2 %    Comment: Performed at [facility], [address] [place], [place] [address]  Basic metabolic panel     Status: Abnormal   Collection Time: 10/12/22  5:29 AM  Result Value Ref Range   Sodium 136 135 - 145 mmol/L   Potassium 3.2 (L) 3.5 - 5.1 mmol/L   Chloride 91 (L) 98 - 111 mmol/L   CO2 38 (H) 22 - 32 mmol/L   Glucose, Bld 104 (H) 70 - 99 mg/dL    Comment: Glucose reference range applies only to samples taken after fasting for at least 8 hours.   BUN 39 (H) 8 - 23 mg/dL   Creatinine, Ser 6.44 0.44 - 1.00 mg/dL   Calcium 7.4 (L) 8.9 - 10.3 mg/dL   GFR, Estimated >03 >47 mL/min    Comment: (NOTE) Calculated using the CKD-EPI Creatinine Equation (2021)    Anion gap 7 5 - 15    Comment: Performed at [facility], [address] [place], [place] [address]  CBG monitoring, ED     Status: None   Collection Time: 10/12/22  7:54 AM  Result Value Ref Range   Glucose-Capillary 90 70 - 99 mg/dL    Comment: Glucose reference range applies only to samples taken after fasting for at least 8 hours.  CBG monitoring, ED     Status: Abnormal   Collection Time: 10/12/22 11:36 AM  Result Value Ref Range   Glucose-Capillary 128 (H) 70 - 99 mg/dL    Comment: Glucose reference range applies only to samples taken after fasting for at least 8 hours.   CT ABDOMEN PELVIS W CONTRAST  Result Date: 10/12/2022 CLINICAL DATA:  Ulcerative colitis. History of new onset rectal bleeding. EXAM: CT ABDOMEN AND PELVIS WITH CONTRAST TECHNIQUE: Multidetector CT imaging of the abdomen and pelvis was performed using the standard protocol following bolus administration of intravenous contrast. RADIATION DOSE REDUCTION: This exam was performed  according to the departmental dose-optimization program which includes automated exposure control, adjustment of the mA and/or kV according to patient size and/or use of iterative reconstruction technique. CONTRAST:  OMNIPAQUE IOHEXOL 300 MG/ML  SOLN COMPARISON:  CT 09/18/2022 FINDINGS: Lower chest: Heart is enlarged. Coronary artery calcifications are seen. There are calcifications along the mitral valve annulus. There is some linear opacity lung bases likely scar or atelectasis. No pleural effusion. Hepatobiliary: Gallbladder is nondilated but has a numerous luminal stones. Slight nodular contours of the liver. Patent portal vein. Once again there are several cystic areas in the liver which are quite small and too small to completely characterize. Based on prior report recommend continued follow up evaluation when appropriate such as MRI or short-term CT follow-up in 3-6 months. Pancreas: Unremarkable. No pancreatic ductal dilatation or surrounding inflammatory changes. Spleen: Stable small splenic cystic lesion. No specific imaging follow-up. Adrenals/Urinary Tract: Right adrenal gland is preserved. Slight thickening of the left adrenal gland. Nonspecific and unchanged. 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 No specific imaging follow up, unchanged. No collecting system dilatation. The ureters have normal course and caliber extending down to the bladder. Preserved contours of the urinary bladder. Stomach/Bowel: Air-fluid level along the stomach. No oral contrast. Small bowel is nondilated. Extensive colonic diverticula identified. There is wall thickening with stranding along the distal descending colon. This was seen on the prior examination and appears similar. This could be an area of diverticulitis. Please correlate with the history. No associated obstruction, free air or rim enhancing fluid collections. Vascular/Lymphatic: Moderate vascular calcifications are identified. Normal caliber aorta  and IVC. No specific abnormal lymph node enlargement identified in the abdomen and pelvis. Reproductive: Status post hysterectomy. No adnexal masses. Other: Small fat containing left inguinal hernia. Musculoskeletal: Advanced degenerative changes of the spine and pelvis. Multilevel stenosis along the spine with bony fusion of the disc space of L2-3. Particular advanced degenerative changes of the right hip as well. Global fatty muscle atrophy. IMPRESSION: Colonic diverticulosis. Once again there is an area of wall thickening with stranding along the descending colon consistent with presumed area of diverticulitis. Recommend follow-up to confirm clearance and exclude secondary pathology. No associated obstruction, free air or fluid collections. Gallstones. As described previously there are multiple low-attenuation cystic lesions in the liver, too small to completely characterize. Probable benign cystic lesions but in principle are indeterminate as per the prior examination recommend continued follow-up with either short-term follow up CT scan in 3-6 months or MRI to further delineate as clinically  appropriate. Electronically Signed   By: Karen Kays M.D.   On: 10/12/2022 10:33   DG Chest Portable 1 View  Result Date: 10/11/2022 CLINICAL DATA:  Weakness EXAM: PORTABLE CHEST 1 VIEW COMPARISON:  07/28/2022 FINDINGS: Cardiac shadow is enlarged but stable. Aortic calcifications are again seen. Lungs are well aerated bilaterally. Mild left basilar scarring is noted. No acute abnormality noted. IMPRESSION: Mild left basilar scarring Electronically Signed   By: Alcide Clever M.D.   On: 10/11/2022 21:28   CT Head Wo Contrast  Result Date: 10/11/2022 CLINICAL DATA:  Recent fall with headaches and neck pain, initial encounter EXAM: CT HEAD WITHOUT CONTRAST CT CERVICAL SPINE WITHOUT CONTRAST TECHNIQUE: Multidetector CT imaging of the head and cervical spine was performed following the standard protocol without intravenous  contrast. Multiplanar CT image reconstructions of the cervical spine were also generated. RADIATION DOSE REDUCTION: This exam was performed according to the departmental dose-optimization program which includes automated exposure control, adjustment of the mA and/or kV according to patient size and/or use of iterative reconstruction technique. COMPARISON:  07/17/2022 FINDINGS: CT HEAD FINDINGS Brain: No evidence of acute infarction, hemorrhage, hydrocephalus, extra-axial collection or mass lesion/mass effect. Mild chronic white matter ischemic changes are seen. Vascular: No hyperdense vessel or unexpected calcification. Skull: Normal. Negative for fracture or focal lesion. Sinuses/Orbits: No acute finding. Other: None. CT CERVICAL SPINE FINDINGS Alignment: Mild loss of the normal cervical lordosis is noted. Skull base and vertebrae: 7 cervical segments are well visualized. Multilevel osteophytic change and facet hypertrophic changes are noted. No acute fracture or acute facet abnormality is noted. The odontoid is within normal limits. Soft tissues and spinal canal: Surrounding soft tissue structures are within normal limits. Heavy atherosclerotic calcifications are seen. Upper chest: Visualized lung apices are within normal limits. Other: None IMPRESSION: CT of the head: No acute intracranial abnormality noted. CT of the cervical spine: Multilevel degenerative change without acute abnormality. Electronically Signed   By: Alcide Clever M.D.   On: 10/11/2022 21:25   CT Cervical Spine Wo Contrast  Result Date: 10/11/2022 CLINICAL DATA:  Recent fall with headaches and neck pain, initial encounter EXAM: CT HEAD WITHOUT CONTRAST CT CERVICAL SPINE WITHOUT CONTRAST TECHNIQUE: Multidetector CT imaging of the head and cervical spine was performed following the standard protocol without intravenous contrast. Multiplanar CT image reconstructions of the cervical spine were also generated. RADIATION DOSE REDUCTION: This exam  was performed according to the departmental dose-optimization program which includes automated exposure control, adjustment of the mA and/or kV according to patient size and/or use of iterative reconstruction technique. COMPARISON:  07/17/2022 FINDINGS: CT HEAD FINDINGS Brain: No evidence of acute infarction, hemorrhage, hydrocephalus, extra-axial collection or mass lesion/mass effect. Mild chronic white matter ischemic changes are seen. Vascular: No hyperdense vessel or unexpected calcification. Skull: Normal. Negative for fracture or focal lesion. Sinuses/Orbits: No acute finding. Other: None. CT CERVICAL SPINE FINDINGS Alignment: Mild loss of the normal cervical lordosis is noted. Skull base and vertebrae: 7 cervical segments are well visualized. Multilevel osteophytic change and facet hypertrophic changes are noted. No acute fracture or acute facet abnormality is noted. The odontoid is within normal limits. Soft tissues and spinal canal: Surrounding soft tissue structures are within normal limits. Heavy atherosclerotic calcifications are seen. Upper chest: Visualized lung apices are within normal limits. Other: None IMPRESSION: CT of the head: No acute intracranial abnormality noted. CT of the cervical spine: Multilevel degenerative change without acute abnormality. Electronically Signed   By: Eulah Pont.D.  On: 10/11/2022 21:25    Pending Labs Unresulted Labs (From admission, onward)     Start     Ordered   10/13/22 0500  Basic metabolic panel  Tomorrow morning,   R        10/12/22 1530   10/12/22 0054  Hemoglobin A1c  Once,   R       Comments: To assess prior glycemic control    10/12/22 0053   10/11/22 2307  Thyroid Panel With TSH  Once,   URGENT        10/11/22 2307            Vitals/Pain Today's Vitals   10/12/22 1300 10/12/22 1430 10/12/22 1517 10/12/22 1529  BP: (S) (!) 88/68 93/64  109/67  Pulse: 65 (!) 102  (!) 128  Resp: (!) 26 20    Temp:      TempSrc:      SpO2: 97%  90%    Weight:   190 lb (86.2 kg)   PainSc:        Isolation Precautions No active isolations  Medications Medications  pantoprazole (PROTONIX) injection 40 mg (40 mg Intravenous Given 10/12/22 0909)  lactated ringers infusion (0 mLs Intravenous Stopped 10/12/22 1133)  acetaminophen (TYLENOL) tablet 650 mg (has no administration in time range)    Or  acetaminophen (TYLENOL) suppository 650 mg (has no administration in time range)  sodium chloride flush (NS) 0.9 % injection 3 mL (3 mLs Intravenous Not Given 10/12/22 0909)  cholecalciferol (VITAMIN D3) 25 MCG (1000 UNIT) tablet 500 Units (500 Units Oral Given 10/12/22 0909)  fluticasone furoate-vilanterol (BREO ELLIPTA) 100-25 MCG/ACT 1 puff (has no administration in time range)  naphazoline-glycerin (CLEAR EYES REDNESS) ophth solution 1-2 drop (has no administration in time range)  albuterol (PROVENTIL) (2.5 MG/3ML) 0.083% nebulizer solution 2.5 mg (has no administration in time range)  insulin aspart (novoLOG) injection 0-15 Units (2 Units Subcutaneous Given 10/12/22 1156)  insulin aspart (novoLOG) injection 0-5 Units ( Subcutaneous Not Given 10/12/22 0809)  diltiazem (CARDIZEM) injection 10 mg (10 mg Intravenous Given 10/12/22 0817)  morphine (PF) 2 MG/ML injection 1 mg (1 mg Intravenous Given 10/12/22 1201)  0.9 %  sodium chloride infusion ( Intravenous New Bag/Given 10/12/22 1337)  amiodarone (NEXTERONE) 1.8 mg/mL load via infusion 150 mg (150 mg Intravenous Bolus from Bag 10/12/22 1350)    Followed by  amiodarone (NEXTERONE PREMIX) 360-4.14 MG/200ML-% (1.8 mg/mL) IV infusion (60 mg/hr Intravenous New Bag/Given 10/12/22 1350)    Followed by  amiodarone (NEXTERONE PREMIX) 360-4.14 MG/200ML-% (1.8 mg/mL) IV infusion (has no administration in time range)  metoprolol tartrate (LOPRESSOR) tablet 12.5 mg (12.5 mg Oral Given 10/12/22 1529)  Ampicillin-Sulbactam (UNASYN) 3 g in sodium chloride 0.9 % 100 mL IVPB (3 g Intravenous New Bag/Given 10/12/22 1531)   potassium chloride SA (KLOR-CON M) CR tablet 40 mEq (40 mEq Oral Given 10/12/22 1533)  lactated ringers bolus 1,000 mL (0 mLs Intravenous Stopped 10/11/22 2216)  potassium chloride 10 mEq in 100 mL IVPB (0 mEq Intravenous Stopped 10/12/22 0227)  pantoprazole (PROTONIX) injection 40 mg (40 mg Intravenous Given 10/11/22 2147)  metoprolol tartrate (LOPRESSOR) injection 5 mg (5 mg Intravenous Given 10/11/22 2240)  lactated ringers bolus 1,000 mL (0 mLs Intravenous Stopped 10/12/22 0036)  potassium chloride (KLOR-CON) packet 40 mEq (40 mEq Oral Given 10/11/22 2337)  diltiazem (CARDIZEM) injection 5 mg (5 mg Intravenous Given 10/12/22 0543)  iohexol (OMNIPAQUE) 300 MG/ML solution 100 mL (100 mLs Intravenous Contrast Given 10/12/22 0944)  Mobility walks with device     Focused Assessments Cardiac Assessment Handoff:  Cardiac Rhythm: Atrial fibrillation No results found for: "CKTOTAL", "CKMB", "CKMBINDEX", "TROPONINI" No results found for: "DDIMER" Does the Patient currently have chest pain? No    R Recommendations: See Admitting Provider Note  Report given to:   Additional Notes:  On 4L [place] at home and currently

## 2022-10-12 NOTE — Consult Note (Signed)
Cardiology Consultation   Patient ID: Sheena Long MRN: [id number]; DOB: [birth year]  Admit date: 10/11/2022 Date of Consult: 10/12/2022  PCP:  Sheena Reel, NP (Inactive)   [facility] Providers Cardiologist:  None      New consult completed by Dr Sheena Long  Patient Profile:   Sheena Long is a 89 y.o. female with a hx of moderate dementia, chronic diastolic congestive heart failure, obesity, COPD, benign essential tremor, osteoarthritis of multiple joints, obstructive sleep apnea, venous insufficiency, peripheral neuropathy, depression, dyslipidemia, and prediabetes, former smoker who is being seen 10/12/2022 for the evaluation of atrial fibrillation RVR at the request of Dr. Allena Long.  History of Present Illness:   Ms. Sheena Long presented to the [facility] emergency department 10/11/2022 via EMS with concerns for GI bleed.  EMS stated that she had bright red blood noted in her depends after a fall the day before and changes in level of consciousness.  Blood pressure was noted to be 93/60 with a heart rate of 110.  She is also noted to wear 4 L of O2 via nasal cannula chronically.  Patient is a poor historian but her family reported that she had been increasingly weak over the course of the day and she had fallen earlier where she had struck her head.  Her home health nurse had noticed that she had blood in her stool just prior to arrival.  Upon further conversation with the patient's granddaughter it was noted that the patient possibly attempted to get out of the bed in the night and had fallen over at bedside commode and was found lying in the floor.  Family reported per record review that she was on a blood thinner but this was unable to be found in her record.  She also had complained of knee discomfort.  On evaluation in the emergency department EKG revealed that she was in atrial fibrillation.  She was given IV fluid boluses with improvement in her blood pressure and follow-up H&H was stable.   Additional labs were remarkable for hypokalemia with no significant AKI.  She was given IV Protonix due to possible upper GI bleed.  Initial vitals: Blood pressure 95/55, pulse of 50, respirations of 18, temperature 97.7  Pertinent labs: Potassium 2.9, chloride 84, CO2 39, blood glucose of 111, BUN of 44, serum creatinine 1.01, calcium 8.3, albumin 3.1, total bilirubin 1.5, GFR 53,  Imaging: CT of the head showed no acute intercranial abnormality; CT of the cervical spine showed multilevel degenerative change without acute abnormality; chest x-ray revealed mild left basilar scarring; CT of the abdomen pelvis revealed colonic diverticulosis, gallstones with multiple low-attenuation cystic lesions in the liver, too small to completely characterize, probable benign cystic lesions but) smaller indeterminate and recommend short-term follow-up with CT scan in 3 to 6 months or MRI to further delineate  Patient's administered in the emergency department: Potassium chloride IVPB, lactated Ringer's 1 L bolus, Protonix 40 mg IVP, metoprolol tartrate 5 mg IVP  Cardiology was consulted to assist with atrial fibrillation RVR  Past Medical History:  Diagnosis Date   CHF (congestive heart failure) (HCC)    On home oxygen therapy    3l/ [place]    History reviewed. No pertinent surgical history.   Home Medications:  Prior to Admission medications   Medication Sig Start Date Sheena Long Date Taking? Authorizing Provider  acetaminophen (TYLENOL) 650 MG CR tablet Take 1,300 mg by mouth in the morning and at bedtime. 10/06/22  Yes [provider]  albuterol (PROVENTIL) (2.5 MG/3ML) 0.083% nebulizer solution Take 2.5 mg by nebulization every 6 (six) hours as needed. 07/29/22  Yes [provider]  bumetanide (BUMEX) 2 MG tablet Take 2 mg by mouth 2 (two) times daily. 10/11/22  Yes [provider]  cholecalciferol (VITAMIN D3) 25 MCG (1000 UNIT) tablet Take 1,000 Units by mouth daily. 10/11/22  Yes  [provider]  cyanocobalamin (VITAMIN B12) 1000 MCG tablet Take 1,000 mcg by mouth daily. 10/11/22  Yes [provider]  gabapentin (NEURONTIN) 300 MG capsule Take 300 mg by mouth at bedtime. 10/11/22  Yes [provider]  JARDIANCE 10 MG TABS tablet Take 10 mg by mouth daily. 10/11/22  Yes [provider]  metolazone (ZAROXOLYN) 2.5 MG tablet Take 2.5 mg by mouth daily. 10/11/22  Yes [provider]  NYSTATIN powder Apply 1 Application topically 2 (two) times daily. 04/23/22  Yes [provider]  OPTIVE 0.5-0.9 % ophthalmic solution Place 1 drop into both eyes 3 (three) times daily as needed. 07/29/22  Yes [provider]  potassium chloride SA (KLOR-CON M) 20 MEQ tablet Take 20 mEq by mouth daily. 10/11/22  Yes [provider]  STOOL SOFTENER 100 MG capsule Take 100 mg by mouth 2 (two) times daily. 10/11/22  Yes [provider]  SYMBICORT 80-4.5 MCG/ACT inhaler Inhale 2 puffs into the lungs daily. 08/11/22  Yes [provider]  azithromycin (ZITHROMAX Z-PAK) 250 MG tablet Take 2 tablets at once on day 1 followed by 1 tablet each day for days 2-5 Patient not taking: Reported on 10/12/2022 07/28/22   Poggi, Eileen Stanford E, PA-C  sertraline (ZOLOFT) 100 MG tablet Take 100 mg by mouth daily. Patient not taking: Reported on 10/12/2022 09/17/22   [provider]  torsemide (DEMADEX) 20 MG tablet Take 40 mg by mouth daily. Patient not taking: Reported on 10/12/2022 05/27/22   [provider]    Inpatient Medications: Scheduled Meds:  cholecalciferol  500 Units Oral Daily   [START ON 10/13/2022] fluticasone furoate-vilanterol  1 puff Inhalation Daily   insulin aspart  0-15 Units Subcutaneous TID WC   insulin aspart  0-5 Units Subcutaneous QHS   metoprolol tartrate  12.5 mg Oral BID   pantoprazole (PROTONIX) IV  40 mg Intravenous Q12H   potassium chloride  40 mEq Oral BID   sodium chloride flush  3 mL Intravenous Q12H    Continuous Infusions:  sodium chloride 75 mL/hr at 10/12/22 1337   amiodarone 60 mg/hr (10/12/22 1607)   Followed by   amiodarone     ampicillin-sulbactam (UNASYN) IV Stopped (10/12/22 1607)   PRN Meds: acetaminophen **OR** acetaminophen, albuterol, diltiazem, morphine injection, naphazoline-glycerin  Allergies:   No Known Allergies  Social History:   Social History   Socioeconomic History   Marital status: Widowed    Spouse name: Not on file   Number of children: Not on file   Years of education: Not on file   Highest education level: Not on file  Occupational History   Not on file  Tobacco Use   Smoking status: Former    Types: Cigarettes   Smokeless tobacco: Never  Substance and Sexual Activity   Alcohol use: Not on file   Drug use: Not on file   Sexual activity: Not on file  Other Topics Concern   Not on file  Social History Narrative   Not on file   Social Determinants of Health   Financial Resource Strain: Not on file  Food  Insecurity: No Food Insecurity (10/12/2022)   Hunger Vital Sign    Worried About Running Out of Food in the Last Year: Never true    Ran Out of Food in the Last Year: Never true  Transportation Needs: No Transportation Needs (10/12/2022)   PRAPARE - Administrator, Civil Service (Medical): No    Lack of Transportation (Non-Medical): No  Physical Activity: Not on file  Stress: Not on file  Social Connections: Not on file  Intimate Partner Violence: Not At Risk (10/12/2022)   Humiliation, Afraid, Rape, and Kick questionnaire    Fear of Current or Ex-Partner: No    Emotionally Abused: No    Physically Abused: No    Sexually Abused: No    Family History:   History reviewed. No pertinent family history.   ROS:  Please see the history of present illness.  Review of Systems  Constitutional:  Positive for malaise/fatigue.  Cardiovascular:  Positive for leg swelling.  Gastrointestinal:  Positive for blood in stool.   Musculoskeletal:  Positive for falls.  Neurological:  Positive for weakness.    All other ROS reviewed and negative.     Physical Exam/Data:   Vitals:   10/12/22 1517 10/12/22 1529 10/12/22 1530 10/12/22 1600  BP:  109/67 (!) 97/58 101/73  Pulse:  (!) 128 (!) 41 65  Resp:    (!) 22  Temp:      TempSrc:      SpO2:   95% 96%  Weight: 86.2 kg       Intake/Output Summary (Last 24 hours) at 10/12/2022 1622 Last data filed at 10/12/2022 0227 Gross per 24 hour  Intake 2400 ml  Output --  Net 2400 ml      10/12/2022    3:17 PM 07/28/2022    2:10 PM  Last 3 Weights  Weight (lbs) 190 lb 185 lb  Weight (kg) 86.183 kg 83.915 kg     Body mass index is 37.11 kg/m.  General:  Well nourished, well developed, in no acute distress HEENT: normal Neck: Unable to determine JVD due to body habitus Vascular: No carotid bruits; Distal pulses 2+ bilaterally Cardiac:  normal S1, S2; IR IR; tachycardic no murmur  Lungs:  clear upper lobes with diminished bases to auscultation bilaterally, respirations are unlabored at rest on 4 L of O2 via nasal cannula Abd: soft, nontender, obese, no hepatomegaly  Ext: 1+ edema Musculoskeletal:  No deformities, BUE and BLE strength normal and equal Skin: warm and dry  Neuro:  CNs 2-12 intact, no focal abnormalities noted Psych:  Normal affect   EKG:  The EKG was personally reviewed and demonstrates: Atrial fibrillation with a rate of 121 with aberrancy versus PVCs Telemetry:  Telemetry was personally reviewed and demonstrates: Atrial fibrillation rates of 100-150  Relevant CV Studies: Echocardiogram ordered and pending  Laboratory Data:  High Sensitivity Troponin:   Recent Labs  Lab 10/12/22 0107  TROPONINIHS 48*     Chemistry Recent Labs  Lab 10/11/22 1853 10/11/22 2217 10/12/22 0107 10/12/22 0529  NA 137  --   --  136  K 2.9*  --  3.9 3.2*  CL 84*  --   --  91*  CO2 39*  --   --  38*  GLUCOSE 111*  --   --  104*  BUN 44*  --   --  39*   CREATININE 1.01*  --   --  0.79  CALCIUM 8.3*  --   --  7.4*  MG  --  2.0  --   --   GFRNONAA 53*  --   --  >60  ANIONGAP 14  --   --  7    Recent Labs  Lab 10/11/22 1853  PROT 6.9  ALBUMIN 3.1*  AST 38  ALT 11  ALKPHOS 47  BILITOT 1.5*   Lipids No results for input(s): "CHOL", "TRIG", "HDL", "LABVLDL", "LDLCALC", "CHOLHDL" in the last 168 hours.  Hematology Recent Labs  Lab 10/11/22 1853 10/11/22 2217 10/12/22 0529  WBC 9.2  --  9.2  RBC 3.90  --  3.75*  HGB 13.0 13.1 12.5  HCT 41.0 41.8 40.4  MCV 105.1*  --  107.7*  MCH 33.3  --  33.3  MCHC 31.7  --  30.9  RDW 13.6  --  13.6  PLT 178  --  174   Thyroid No results for input(s): "TSH", "FREET4" in the last 168 hours.  BNPNo results for input(s): "BNP", "PROBNP" in the last 168 hours.  DDimer No results for input(s): "DDIMER" in the last 168 hours.   Radiology/Studies:  CT ABDOMEN PELVIS W CONTRAST  Result Date: 10/12/2022 CLINICAL DATA:  Ulcerative colitis. History of new onset rectal bleeding. EXAM: CT ABDOMEN AND PELVIS WITH CONTRAST TECHNIQUE: Multidetector CT imaging of the abdomen and pelvis was performed using the standard protocol following bolus administration of intravenous contrast. RADIATION DOSE REDUCTION: This exam was performed according to the departmental dose-optimization program which includes automated exposure control, adjustment of the mA and/or kV according to patient size and/or use of iterative reconstruction technique. CONTRAST:  OMNIPAQUE IOHEXOL 300 MG/ML  SOLN COMPARISON:  CT 09/18/2022 FINDINGS: Lower chest: Heart is enlarged. Coronary artery calcifications are seen. There are calcifications along the mitral valve annulus. There is some linear opacity lung bases likely scar or atelectasis. No pleural effusion. Hepatobiliary: Gallbladder is nondilated but has a numerous luminal stones. Slight nodular contours of the liver. Patent portal vein. Once again there are several cystic areas in the  liver which are quite small and too small to completely characterize. Based on prior report recommend continued follow up evaluation when appropriate such as MRI or short-term CT follow-up in 3-6 months. Pancreas: Unremarkable. No pancreatic ductal dilatation or surrounding inflammatory changes. Spleen: Stable small splenic cystic lesion. No specific imaging follow-up. Adrenals/Urinary Tract: Right adrenal gland is preserved. Slight thickening of the left adrenal gland. Nonspecific and unchanged. 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 No specific imaging follow up, unchanged. No collecting system dilatation. The ureters have normal course and caliber extending down to the bladder. Preserved contours of the urinary bladder. Stomach/Bowel: Air-fluid level along the stomach. No oral contrast. Small bowel is nondilated. Extensive colonic diverticula identified. There is wall thickening with stranding along the distal descending colon. This was seen on the prior examination and appears similar. This could be an area of diverticulitis. Please correlate with the history. No associated obstruction, free air or rim enhancing fluid collections. Vascular/Lymphatic: Moderate vascular calcifications are identified. Normal caliber aorta and IVC. No specific abnormal lymph node enlargement identified in the abdomen and pelvis. Reproductive: Status post hysterectomy. No adnexal masses. Other: Small fat containing left inguinal hernia. Musculoskeletal: Advanced degenerative changes of the spine and pelvis. Multilevel stenosis along the spine with bony fusion of the disc space of L2-3. Particular advanced degenerative changes of the right hip as well. Global fatty muscle atrophy. IMPRESSION: Colonic diverticulosis. Once again there is an area of wall thickening with  stranding along the descending colon consistent with presumed area of diverticulitis. Recommend follow-up to confirm clearance and exclude secondary pathology.  No associated obstruction, free air or fluid collections. Gallstones. As described previously there are multiple low-attenuation cystic lesions in the liver, too small to completely characterize. Probable benign cystic lesions but in principle are indeterminate as per the prior examination recommend continued follow-up with either short-term follow up CT scan in 3-6 months or MRI to further delineate as clinically appropriate. Electronically Signed   By: Karen Kays M.D.   On: 10/12/2022 10:33   DG Chest Portable 1 View  Result Date: 10/11/2022 CLINICAL DATA:  Weakness EXAM: PORTABLE CHEST 1 VIEW COMPARISON:  07/28/2022 FINDINGS: Cardiac shadow is enlarged but stable. Aortic calcifications are again seen. Lungs are well aerated bilaterally. Mild left basilar scarring is noted. No acute abnormality noted. IMPRESSION: Mild left basilar scarring Electronically Signed   By: Alcide Clever M.D.   On: 10/11/2022 21:28   CT Head Wo Contrast  Result Date: 10/11/2022 CLINICAL DATA:  Recent fall with headaches and neck pain, initial encounter EXAM: CT HEAD WITHOUT CONTRAST CT CERVICAL SPINE WITHOUT CONTRAST TECHNIQUE: Multidetector CT imaging of the head and cervical spine was performed following the standard protocol without intravenous contrast. Multiplanar CT image reconstructions of the cervical spine were also generated. RADIATION DOSE REDUCTION: This exam was performed according to the departmental dose-optimization program which includes automated exposure control, adjustment of the mA and/or kV according to patient size and/or use of iterative reconstruction technique. COMPARISON:  07/17/2022 FINDINGS: CT HEAD FINDINGS Brain: No evidence of acute infarction, hemorrhage, hydrocephalus, extra-axial collection or mass lesion/mass effect. Mild chronic white matter ischemic changes are seen. Vascular: No hyperdense vessel or unexpected calcification. Skull: Normal. Negative for fracture or focal lesion.  Sinuses/Orbits: No acute finding. Other: None. CT CERVICAL SPINE FINDINGS Alignment: Mild loss of the normal cervical lordosis is noted. Skull base and vertebrae: 7 cervical segments are well visualized. Multilevel osteophytic change and facet hypertrophic changes are noted. No acute fracture or acute facet abnormality is noted. The odontoid is within normal limits. Soft tissues and spinal canal: Surrounding soft tissue structures are within normal limits. Heavy atherosclerotic calcifications are seen. Upper chest: Visualized lung apices are within normal limits. Other: None IMPRESSION: CT of the head: No acute intracranial abnormality noted. CT of the cervical spine: Multilevel degenerative change without acute abnormality. Electronically Signed   By: Alcide Clever M.D.   On: 10/11/2022 21:25   CT Cervical Spine Wo Contrast  Result Date: 10/11/2022 CLINICAL DATA:  Recent fall with headaches and neck pain, initial encounter EXAM: CT HEAD WITHOUT CONTRAST CT CERVICAL SPINE WITHOUT CONTRAST TECHNIQUE: Multidetector CT imaging of the head and cervical spine was performed following the standard protocol without intravenous contrast. Multiplanar CT image reconstructions of the cervical spine were also generated. RADIATION DOSE REDUCTION: This exam was performed according to the departmental dose-optimization program which includes automated exposure control, adjustment of the mA and/or kV according to patient size and/or use of iterative reconstruction technique. COMPARISON:  07/17/2022 FINDINGS: CT HEAD FINDINGS Brain: No evidence of acute infarction, hemorrhage, hydrocephalus, extra-axial collection or mass lesion/mass effect. Mild chronic white matter ischemic changes are seen. Vascular: No hyperdense vessel or unexpected calcification. Skull: Normal. Negative for fracture or focal lesion. Sinuses/Orbits: No acute finding. Other: None. CT CERVICAL SPINE FINDINGS Alignment: Mild loss of the normal cervical lordosis  is noted. Skull base and vertebrae: 7 cervical segments are well visualized. Multilevel osteophytic change and  facet hypertrophic changes are noted. No acute fracture or acute facet abnormality is noted. The odontoid is within normal limits. Soft tissues and spinal canal: Surrounding soft tissue structures are within normal limits. Heavy atherosclerotic calcifications are seen. Upper chest: Visualized lung apices are within normal limits. Other: None IMPRESSION: CT of the head: No acute intracranial abnormality noted. CT of the cervical spine: Multilevel degenerative change without acute abnormality. Electronically Signed   By: Alcide Clever M.D.   On: 10/11/2022 21:25     Assessment and Plan:   Atrial fibrillation with RVR -Found to be in atrial fibrillation RVR on arrival -New diagnosis of undetermined timeframe -Thyroid panel pending -Echocardiogram ordered and pending with further recommendations to follow -Currently on amiodarone infusion -Not an ideal candidate for anticoagulation due to recurrent falls -CHA2DS2-VASc score of at least 6 -Started on metoprolol 12.5 mg twice daily -Continue with telemetry monitoring -If rate becomes uncontrollable and she continues to have hypotension may have to consider TEE/DCCV  GI bleeding -Hemoglobin has remained stable -Etiology unclear CT of the abdomen shows distal colonic diverticulitis and extensive diverticulosis -GI has been consulted -Continued management by GI and IM -Recommend transfusion to maintain hemoglobin greater than equal to 8  Hypokalemia -Potassium 3.2 -Serum potassium on admission 2.9 -Continue with potassium supplements -Maintain potassium greater than 4 less than 5 -Daily BMP -Monitor/trend/replete electrolytes as needed  Obstructive sleep apnea -Patient recommended to sleep with CPAP nightly but she has declined -Per family she does not use device at home as recommended  History of dementia/cognitive  decline -History and account of events leading to hospitalization given by nephew and granddaughter    Risk Assessment/Risk Scores:          CHA2DS2-VASc Score = 6   This indicates a 9.7% annual risk of stroke. The patient's score is based upon: CHF History: 1 HTN History: 1 Diabetes History: 1 Stroke History: 0 Vascular Disease History: 0 Age Score: 2 Gender Score: 1         For questions or updates, please contact [facility] Please consult [email or web address] for contact info under    Signed, Rosemary Mossbarger, NP  10/12/2022 4:22 PM

## 2022-10-12 NOTE — Consult Note (Signed)
Consultation  Referring Provider:   Dr Maryjean Ka   Admit date 10/11/22 Consult date      10/12/22   Reason for Consultation:     rectal bleeding         HPI:   Sheena Long is a 89 y.o. 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 Was admitted yesterday with weakness and painless rectal bleeding. She was also noted to have rapid afib (new) with hypotension and AKI so given fluids and antiarrythmics. Her hemoglobin is stable/normal at 13 and  12.5 (respectively) and she has normocytic red cells. Her bun and creatinine are improving with current- latest GFR >60. PT/INR unremarkable. Patient states that it is 38 and she is at [facility]. She knows her name and birthdate. We discussed time and location. She is not a great historian. History gleaned from chart. She denies any abdominal pain and is unclear about any rectal bleeding- last episode might have been yesterday she says. She denies any gerd/dysphagia. States she had abdominal pain at one point but not now. Denies any other GI concerns. She is on amiodarone for rapid afib, rate currently 130-140s and she is mildly hypotensive. She has been started on pantoprazole IV. I also called her [facility] nurse [name]- patient lives at home alone with family support- evidently patient was found on floor by niece yesterday near bsc with a dark stool in it. It is unknown if there have been any episodes of rectal bleeding and there have been no other known GI issues with the exception of being seen 3w ago at [facility] for abdmoinal pain- was found to have diverticulitis and treated with 10d Augmentin. It is reported that patient's mental status has also been declining of late. It is unsure if she has had colonoscopy.  PREVIOUS ENDOSCOPIES:            None documented  CT A/P today: IMPRESSION: Colonic diverticulosis. Once again there is an area of wall thickening with stranding along the descending colon consistent  with presumed area of diverticulitis. Recommend follow-up to confirm clearance and exclude secondary pathology. No associated obstruction, free air or fluid collections.  Gallstones.  As described previously there are multiple low-attenuation cystic lesions in the liver, too small to completely characterize. Probable benign cystic lesions but in principle are indeterminate as per the prior examination recommend continued follow-up with either short-term follow up CT scan in 3-6 months or MRI to further delineate as clinically appropriate.  CT A/P 09/18/22- IMPRESSION: 1. Severe distal colonic diverticulosis, most severe within the sigmoid colon. Superimposed mild pericolonic inflammatory stranding involving the proximal sigmoid colon may reflect changes of very mild superimposed uncomplicated sigmoid diverticulitis. 2. Cholelithiasis. 3. Multi-vessel coronary artery calcification, extensive within the left anterior descending coronary artery proximally. 4. Extensive aortoiliac atherosclerotic calcification. Particularly prominent atherosclerotic calcification is seen at the origin of the mesenteric and renal vasculature bilaterally, however, the degree of stenosis is not well assessed on this examination. If there is clinical evidence of chronic mesenteric ischemia or hemodynamically significant renal artery stenosis, CT arteriography may be helpful for further evaluation. 5. Multiple scattered hepatic hypodensities, largely too small to characterize though the largest of which is in keeping with a simple cyst. These lesions were not clearly identified on a prior sonogram of 07/01/2016 and are therefore indeterminate. If indicated, this could be further assessed with dedicated MRI examination. 6. Advanced degenerative changes within the lumbar spine and asymmetrically within the right hip.  Past Medical History:  Diagnosis Date   CHF (congestive heart failure) (HCC)    On home  oxygen therapy    3l/ [place]    History reviewed. No pertinent surgical history.  History reviewed. No pertinent family history.   Social History   Tobacco Use   Smoking status: Former    Types: Cigarettes   Smokeless tobacco: Never    Prior to Admission medications   Medication Sig Start Date End Date Taking? Authorizing Provider  acetaminophen (TYLENOL) 650 MG CR tablet Take 1,300 mg by mouth in the morning and at bedtime. 10/06/22  Yes [provider]  albuterol (PROVENTIL) (2.5 MG/3ML) 0.083% nebulizer solution Take 2.5 mg by nebulization every 6 (six) hours as needed. 07/29/22  Yes [provider]  bumetanide (BUMEX) 2 MG tablet Take 2 mg by mouth 2 (two) times daily. 10/11/22  Yes [provider]  cholecalciferol (VITAMIN D3) 25 MCG (1000 UNIT) tablet Take 1,000 Units by mouth daily. 10/11/22  Yes [provider]  cyanocobalamin (VITAMIN B12) 1000 MCG tablet Take 1,000 mcg by mouth daily. 10/11/22  Yes [provider]  gabapentin (NEURONTIN) 300 MG capsule Take 300 mg by mouth at bedtime. 10/11/22  Yes [provider]  JARDIANCE 10 MG TABS tablet Take 10 mg by mouth daily. 10/11/22  Yes [provider]  metolazone (ZAROXOLYN) 2.5 MG tablet Take 2.5 mg by mouth daily. 10/11/22  Yes [provider]  NYSTATIN powder Apply 1 Application topically 2 (two) times daily. 04/23/22  Yes [provider]  OPTIVE 0.5-0.9 % ophthalmic solution Place 1 drop into both eyes 3 (three) times daily as needed. 07/29/22  Yes [provider]  potassium chloride SA (KLOR-CON M) 20 MEQ tablet Take 20 mEq by mouth daily. 10/11/22  Yes [provider]  STOOL SOFTENER 100 MG capsule Take 100 mg by mouth 2 (two) times daily. 10/11/22  Yes [provider]  SYMBICORT 80-4.5 MCG/ACT inhaler Inhale 2 puffs into the lungs daily. 08/11/22  Yes [provider]  azithromycin (ZITHROMAX Z-PAK) 250 MG tablet Take 2 tablets at  once on day 1 followed by 1 tablet each day for days 2-5 Patient not taking: Reported on 10/12/2022 07/28/22   Poggi, Eileen Stanford E, PA-C  sertraline (ZOLOFT) 100 MG tablet Take 100 mg by mouth daily. Patient not taking: Reported on 10/12/2022 09/17/22   [provider]  torsemide (DEMADEX) 20 MG tablet Take 40 mg by mouth daily. Patient not taking: Reported on 10/12/2022 05/27/22   [provider]    Current Facility-Administered Medications  Medication Dose Route Frequency Provider Last Rate Last Admin   0.9 %  sodium chloride infusion   Intravenous Continuous Enedina Finner, MD 75 mL/hr at 10/12/22 1337 New Bag at 10/12/22 1337   acetaminophen (TYLENOL) tablet 650 mg  650 mg Oral Q6H PRN Nolberto Hanlon, MD       Or   acetaminophen (TYLENOL) suppository 650 mg  650 mg Rectal Q6H PRN Nolberto Hanlon, MD       albuterol (PROVENTIL) (2.5 MG/3ML) 0.083% nebulizer solution 2.5 mg  2.5 mg Inhalation Q6H PRN Nolberto Hanlon, MD       amiodarone (NEXTERONE) 1.8 mg/mL load via infusion 150 mg  150 mg Intravenous Once Enedina Finner, MD       Followed by   amiodarone (NEXTERONE PREMIX) 360-4.14 MG/200ML-% (1.8 mg/mL) IV infusion  60 mg/hr Intravenous Continuous Enedina Finner, MD       Followed by   amiodarone (  NEXTERONE PREMIX) 360-4.14 MG/200ML-% (1.8 mg/mL) IV infusion  30 mg/hr Intravenous Continuous Enedina Finner, MD       cholecalciferol (VITAMIN D3) 25 MCG (1000 UNIT) tablet 500 Units  500 Units Oral Daily Nolberto Hanlon, MD   500 Units at 10/12/22 0909   diltiazem (CARDIZEM) injection 10 mg  10 mg Intravenous Q6H PRN Enedina Finner, MD   10 mg at 10/12/22 0817   [START ON 10/13/2022] fluticasone furoate-vilanterol (BREO ELLIPTA) 100-25 MCG/ACT 1 puff  1 puff Inhalation Daily Nolberto Hanlon, MD       insulin aspart (novoLOG) injection 0-15 Units  0-15 Units Subcutaneous TID WC Nolberto Hanlon, MD   2 Units at 10/12/22 1156   insulin aspart (novoLOG) injection 0-5 Units  0-5 Units Subcutaneous QHS Nolberto Hanlon, MD       lactated  ringers infusion   Intravenous Continuous Enedina Finner, MD   Stopped at 10/12/22 1330   morphine (PF) 2 MG/ML injection 1 mg  1 mg Intravenous Q6H PRN Enedina Finner, MD   1 mg at 10/12/22 1201   naphazoline-glycerin (CLEAR EYES REDNESS) ophth solution 1-2 drop  1-2 drop Both Eyes QID PRN Nolberto Hanlon, MD       pantoprazole (PROTONIX) injection 40 mg  40 mg Intravenous Conchita Paris, MD   40 mg at 10/12/22 0909   sodium chloride flush (NS) 0.9 % injection 3 mL  3 mL Intravenous Conchita Paris, MD   3 mL at 10/11/22 2338   Current Outpatient Medications  Medication Sig Dispense Refill   acetaminophen (TYLENOL) 650 MG CR tablet Take 1,300 mg by mouth in the morning and at bedtime.     albuterol (PROVENTIL) (2.5 MG/3ML) 0.083% nebulizer solution Take 2.5 mg by nebulization every 6 (six) hours as needed.     bumetanide (BUMEX) 2 MG tablet Take 2 mg by mouth 2 (two) times daily.     cholecalciferol (VITAMIN D3) 25 MCG (1000 UNIT) tablet Take 1,000 Units by mouth daily.     cyanocobalamin (VITAMIN B12) 1000 MCG tablet Take 1,000 mcg by mouth daily.     gabapentin (NEURONTIN) 300 MG capsule Take 300 mg by mouth at bedtime.     JARDIANCE 10 MG TABS tablet Take 10 mg by mouth daily.     metolazone (ZAROXOLYN) 2.5 MG tablet Take 2.5 mg by mouth daily.     NYSTATIN powder Apply 1 Application topically 2 (two) times daily.     OPTIVE 0.5-0.9 % ophthalmic solution Place 1 drop into both eyes 3 (three) times daily as needed.     potassium chloride SA (KLOR-CON M) 20 MEQ tablet Take 20 mEq by mouth daily.     STOOL SOFTENER 100 MG capsule Take 100 mg by mouth 2 (two) times daily.     SYMBICORT 80-4.5 MCG/ACT inhaler Inhale 2 puffs into the lungs daily.     azithromycin (ZITHROMAX Z-PAK) 250 MG tablet Take 2 tablets at once on day 1 followed by 1 tablet each day for days 2-5 (Patient not taking: Reported on 10/12/2022) 6 each 0   sertraline (ZOLOFT) 100 MG tablet Take 100 mg by mouth daily. (Patient not taking:  Reported on 10/12/2022)     torsemide (DEMADEX) 20 MG tablet Take 40 mg by mouth daily. (Patient not taking: Reported on 10/12/2022)      Allergies as of 10/11/2022   (No Known Allergies)     Review of Systems:    All systems reviewed and negative except where noted in  HPI.     Physical Exam:  Vital signs in last 24 hours: Temp:  [97.7 F (36.5 C)-98 F (36.7 C)] 97.8 F (36.6 C) (07/02 1231) Pulse Rate:  [32-115] 65 (07/02 1300) Resp:  [16-31] 26 (07/02 1300) BP: (85-132)/(51-98) 88/68 (07/02 1300) SpO2:  [90 %-100 %] 97 % (07/02 1300)   General:   Pleasant woman in NAD Head:  Normocephalic and atraumatic. Eyes:   No icterus.   Conjunctiva pink. Ears:  Normal auditory acuity. Mouth: Mucosa pink moist, no lesions. Neck:  Supple; no masses felt Lungs:  Respirations even and unlabored. Lungs clear to auscultation bilaterally.   No wheezes, crackles, or rhonchi.  Heart:  S1S2, RRR, no MRG. No edema. Abdomen:   Flat, soft, nondistended, nontender. Normal bowel sounds. No appreciable masses or hepatomegaly. No rebound signs or other peritoneal signs. Rectal:  Not performed.  Msk:  MAEW x4, No clubbing or cyanosis. Strength 5/5. Symmetrical without gross deformities. Neurologic:  Alert and  oriented x4;  Cranial nerves II-XII intact.  Skin:  Warm, dry, pink without significant lesions or rashes. Psych:  Alert and cooperative. Normal affect.  LAB RESULTS: Recent Labs    10/11/22 1853 10/11/22 2217 10/12/22 0529  WBC 9.2  --  9.2  HGB 13.0 13.1 12.5  HCT 41.0 41.8 40.4  PLT 178  --  174   BMET Recent Labs    10/11/22 1853 10/12/22 0107 10/12/22 0529  NA 137  --  136  K 2.9* 3.9 3.2*  CL 84*  --  91*  CO2 39*  --  38*  GLUCOSE 111*  --  104*  BUN 44*  --  39*  CREATININE 1.01*  --  0.79  CALCIUM 8.3*  --  7.4*   LFT Recent Labs    10/11/22 1853  PROT 6.9  ALBUMIN 3.1*  AST 38  ALT 11  ALKPHOS 47  BILITOT 1.5*   PT/INR Recent Labs    10/11/22 2056   LABPROT 15.2  INR 1.2    STUDIES: CT ABDOMEN PELVIS W CONTRAST  Result Date: 10/12/2022 CLINICAL DATA:  Ulcerative colitis. History of new onset rectal bleeding. EXAM: CT ABDOMEN AND PELVIS WITH CONTRAST TECHNIQUE: Multidetector CT imaging of the abdomen and pelvis was performed using the standard protocol following bolus administration of intravenous contrast. RADIATION DOSE REDUCTION: This exam was performed according to the departmental dose-optimization program which includes automated exposure control, adjustment of the mA and/or kV according to patient size and/or use of iterative reconstruction technique. CONTRAST:  OMNIPAQUE IOHEXOL 300 MG/ML  SOLN COMPARISON:  CT 09/18/2022 FINDINGS: Lower chest: Heart is enlarged. Coronary artery calcifications are seen. There are calcifications along the mitral valve annulus. There is some linear opacity lung bases likely scar or atelectasis. No pleural effusion. Hepatobiliary: Gallbladder is nondilated but has a numerous luminal stones. Slight nodular contours of the liver. Patent portal vein. Once again there are several cystic areas in the liver which are quite small and too small to completely characterize. Based on prior report recommend continued follow up evaluation when appropriate such as MRI or short-term CT follow-up in 3-6 months. Pancreas: Unremarkable. No pancreatic ductal dilatation or surrounding inflammatory changes. Spleen: Stable small splenic cystic lesion. No specific imaging follow-up. Adrenals/Urinary Tract: Right adrenal gland is preserved. Slight thickening of the left adrenal gland. Nonspecific and unchanged. 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 No specific imaging follow up, unchanged. No collecting system dilatation. The ureters have normal course and caliber extending  down to the bladder. Preserved contours of the urinary bladder. Stomach/Bowel: Air-fluid level along the stomach. No oral contrast. Small bowel is  nondilated. Extensive colonic diverticula identified. There is wall thickening with stranding along the distal descending colon. This was seen on the prior examination and appears similar. This could be an area of diverticulitis. Please correlate with the history. No associated obstruction, free air or rim enhancing fluid collections. Vascular/Lymphatic: Moderate vascular calcifications are identified. Normal caliber aorta and IVC. No specific abnormal lymph node enlargement identified in the abdomen and pelvis. Reproductive: Status post hysterectomy. No adnexal masses. Other: Small fat containing left inguinal hernia. Musculoskeletal: Advanced degenerative changes of the spine and pelvis. Multilevel stenosis along the spine with bony fusion of the disc space of L2-3. Particular advanced degenerative changes of the right hip as well. Global fatty muscle atrophy. IMPRESSION: Colonic diverticulosis. Once again there is an area of wall thickening with stranding along the descending colon consistent with presumed area of diverticulitis. Recommend follow-up to confirm clearance and exclude secondary pathology. No associated obstruction, free air or fluid collections. Gallstones. As described previously there are multiple low-attenuation cystic lesions in the liver, too small to completely characterize. Probable benign cystic lesions but in principle are indeterminate as per the prior examination recommend continued follow-up with either short-term follow up CT scan in 3-6 months or MRI to further delineate as clinically appropriate. Electronically Signed   By: Karen Kays M.D.   On: 10/12/2022 10:33   DG Chest Portable 1 View  Result Date: 10/11/2022 CLINICAL DATA:  Weakness EXAM: PORTABLE CHEST 1 VIEW COMPARISON:  07/28/2022 FINDINGS: Cardiac shadow is enlarged but stable. Aortic calcifications are again seen. Lungs are well aerated bilaterally. Mild left basilar scarring is noted. No acute abnormality noted.  IMPRESSION: Mild left basilar scarring Electronically Signed   By: Alcide Clever M.D.   On: 10/11/2022 21:28   CT Head Wo Contrast  Result Date: 10/11/2022 CLINICAL DATA:  Recent fall with headaches and neck pain, initial encounter EXAM: CT HEAD WITHOUT CONTRAST CT CERVICAL SPINE WITHOUT CONTRAST TECHNIQUE: Multidetector CT imaging of the head and cervical spine was performed following the standard protocol without intravenous contrast. Multiplanar CT image reconstructions of the cervical spine were also generated. RADIATION DOSE REDUCTION: This exam was performed according to the departmental dose-optimization program which includes automated exposure control, adjustment of the mA and/or kV according to patient size and/or use of iterative reconstruction technique. COMPARISON:  07/17/2022 FINDINGS: CT HEAD FINDINGS Brain: No evidence of acute infarction, hemorrhage, hydrocephalus, extra-axial collection or mass lesion/mass effect. Mild chronic white matter ischemic changes are seen. Vascular: No hyperdense vessel or unexpected calcification. Skull: Normal. Negative for fracture or focal lesion. Sinuses/Orbits: No acute finding. Other: None. CT CERVICAL SPINE FINDINGS Alignment: Mild loss of the normal cervical lordosis is noted. Skull base and vertebrae: 7 cervical segments are well visualized. Multilevel osteophytic change and facet hypertrophic changes are noted. No acute fracture or acute facet abnormality is noted. The odontoid is within normal limits. Soft tissues and spinal canal: Surrounding soft tissue structures are within normal limits. Heavy atherosclerotic calcifications are seen. Upper chest: Visualized lung apices are within normal limits. Other: None IMPRESSION: CT of the head: No acute intracranial abnormality noted. CT of the cervical spine: Multilevel degenerative change without acute abnormality. Electronically Signed   By: Alcide Clever M.D.   On: 10/11/2022 21:25   CT Cervical Spine Wo  Contrast  Result Date: 10/11/2022 CLINICAL DATA:  Recent fall with headaches  and neck pain, initial encounter EXAM: CT HEAD WITHOUT CONTRAST CT CERVICAL SPINE WITHOUT CONTRAST TECHNIQUE: Multidetector CT imaging of the head and cervical spine was performed following the standard protocol without intravenous contrast. Multiplanar CT image reconstructions of the cervical spine were also generated. RADIATION DOSE REDUCTION: This exam was performed according to the departmental dose-optimization program which includes automated exposure control, adjustment of the mA and/or kV according to patient size and/or use of iterative reconstruction technique. COMPARISON:  07/17/2022 FINDINGS: CT HEAD FINDINGS Brain: No evidence of acute infarction, hemorrhage, hydrocephalus, extra-axial collection or mass lesion/mass effect. Mild chronic white matter ischemic changes are seen. Vascular: No hyperdense vessel or unexpected calcification. Skull: Normal. Negative for fracture or focal lesion. Sinuses/Orbits: No acute finding. Other: None. CT CERVICAL SPINE FINDINGS Alignment: Mild loss of the normal cervical lordosis is noted. Skull base and vertebrae: 7 cervical segments are well visualized. Multilevel osteophytic change and facet hypertrophic changes are noted. No acute fracture or acute facet abnormality is noted. The odontoid is within normal limits. Soft tissues and spinal canal: Surrounding soft tissue structures are within normal limits. Heavy atherosclerotic calcifications are seen. Upper chest: Visualized lung apices are within normal limits. Other: None IMPRESSION: CT of the head: No acute intracranial abnormality noted. CT of the cervical spine: Multilevel degenerative change without acute abnormality. Electronically Signed   By: Alcide Clever M.D.   On: 10/11/2022 21:25       Impression / Plan:   Possible rectal bleeding v possible melena- has had recent CT confirmed diverticulitis treated with 10d augmentin. This  can take some time to improve on imaging. Patient currently denying abdominal pain and has a normal hemoglobin. She has a benign abdomen that is nontender. Agree with pantoprazole. Recommend bowel regimen - start with daily psyllium to help prevent constipation and could repeat imaging in 2-5m for re-evaluation. No current plans for luminal evaluation based on increased risk of old age and cardiorespiratory comborbidiies.  Thank you very much for this consult. These services were provided by Vevelyn Pat, NP-C, in collaboration with Regis Bill, MD, with whom I have discussed this patient in full.   Vevelyn Pat, NP-C

## 2022-10-12 NOTE — ED Notes (Signed)
Gwenyth Allegra MD provider at [facility] [phone]

## 2022-10-12 NOTE — Progress Notes (Signed)
*  PRELIMINARY RESULTS* Echocardiogram 2D Echocardiogram has been performed.  Sheena Long 10/12/2022, 9:21 AM

## 2022-10-12 NOTE — Consult Note (Signed)
PHARMACY CONSULT NOTE - FOLLOW UP  Pharmacy Consult for Electrolyte Monitoring and Replacement   Recent Labs: Potassium (mmol/L)  Date Value  10/12/2022 3.2 (L)   Magnesium (mg/dL)  Date Value  16/01/9603 2.0   Calcium (mg/dL)  Date Value  54/12/8117 7.4 (L)   Albumin (g/dL)  Date Value  14/78/2956 3.1 (L)   Sodium (mmol/L)  Date Value  10/12/2022 136     Assessment: 89 year old female, with dementia and hx of CHF. Presented with possible GI bleed. K+ 2.9 > 3.2. Pt is on amiodarone gtt for new afib.  Holding home bumex.   On NS @ 75 ml/hr.   Goal of Therapy:  K+ 4 Mg 2.   Plan:  Kcl 40 mEq x 2.  F/u with AM labs.   Ronnald Ramp ,PharmD Clinical Pharmacist 10/12/2022 3:22 PM

## 2022-10-12 NOTE — Consult Note (Signed)
Pharmacy Antibiotic Note  Sheena Long is a 89 y.o. female admitted on 10/11/2022 with  Intra-abdominal infection .  Pharmacy has been consulted for Unasyn dosing. CT confirmed diverticulitis treated with 10d augmentin on 09/19/22.   Plan: Started Unasyn 3 g q6H.   Weight: 86.2 kg (190 lb)  Temp (24hrs), Avg:97.8 F (36.6 C), Min:97.7 F (36.5 C), Max:98 F (36.7 C)  Recent Labs  Lab 10/11/22 1853 10/12/22 0529  WBC 9.2 9.2  CREATININE 1.01* 0.79    Estimated Creatinine Clearance: 46.5 mL/min (by C-G formula based on SCr of 0.79 mg/dL).    No Known Allergies  Antimicrobials this admission: 7/2 Unasyn >>   Dose adjustments this admission: None  Microbiology results: None.   Thank you for allowing pharmacy to be a part of this patient's care.  Ronnald Ramp, PharmD, BCPS 10/12/2022 3:19 PM

## 2022-10-13 DIAGNOSIS — R531 Weakness: Secondary | ICD-10-CM

## 2022-10-13 DIAGNOSIS — K5792 Diverticulitis of intestine, part unspecified, without perforation or abscess without bleeding: Secondary | ICD-10-CM | POA: Diagnosis present

## 2022-10-13 LAB — BASIC METABOLIC PANEL
Anion gap: 12 (ref 5–15)
BUN: 25 mg/dL — ABNORMAL HIGH (ref 8–23)
CO2: 34 mmol/L — ABNORMAL HIGH (ref 22–32)
Calcium: 7.6 mg/dL — ABNORMAL LOW (ref 8.9–10.3)
Chloride: 92 mmol/L — ABNORMAL LOW (ref 98–111)
Creatinine, Ser: 0.71 mg/dL (ref 0.44–1.00)
GFR, Estimated: >60 mL/min (ref >60)
Glucose, Bld: 79 mg/dL (ref 70–99)
Potassium: 3.4 mmol/L — ABNORMAL LOW (ref 3.5–5.1)
Sodium: 138 mmol/L (ref 135–145)

## 2022-10-13 LAB — THYROID PANEL WITH TSH
Free Thyroxine Index: 1.4 (ref 1.2–4.9)
T3 Uptake Ratio: 29 % (ref 24–39)
T4, Total: 4.8 ug/dL (ref 4.5–12.0)
TSH: 4.38 u[IU]/mL (ref 0.450–4.500)

## 2022-10-13 LAB — HEMOGLOBIN A1C
Hgb A1c MFr Bld: 5.4 % (ref 4.8–5.6)
Mean Plasma Glucose: 108 mg/dL

## 2022-10-13 LAB — GLUCOSE, CAPILLARY
Glucose-Capillary: 102 mg/dL — ABNORMAL HIGH (ref 70–99)
Glucose-Capillary: 106 mg/dL — ABNORMAL HIGH (ref 70–99)
Glucose-Capillary: 118 mg/dL — ABNORMAL HIGH (ref 70–99)
Glucose-Capillary: 119 mg/dL — ABNORMAL HIGH (ref 70–99)

## 2022-10-13 LAB — MAGNESIUM: Magnesium: 2 mg/dL (ref 1.7–2.4)

## 2022-10-13 MED ORDER — POTASSIUM CHLORIDE CRYS ER 20 MEQ PO TBCR
40.0000 meq | EXTENDED_RELEASE_TABLET | ORAL | Status: AC
  Administered 2022-10-13 (×2): 40 meq via ORAL
  Filled 2022-10-13: qty 2

## 2022-10-13 MED ORDER — TRAZODONE HCL 50 MG PO TABS
25.0000 mg | ORAL_TABLET | Freq: Every evening | ORAL | Status: DC | PRN
  Administered 2022-10-13 (×2): 25 mg via ORAL
  Filled 2022-10-13 (×2): qty 1

## 2022-10-13 MED ORDER — LORAZEPAM 0.5 MG PO TABS
0.5000 mg | ORAL_TABLET | ORAL | Status: DC | PRN
  Administered 2022-10-13: 0.5 mg via ORAL
  Filled 2022-10-13: qty 1

## 2022-10-13 MED ORDER — PANTOPRAZOLE SODIUM 40 MG PO TBEC
40.0000 mg | DELAYED_RELEASE_TABLET | Freq: Two times a day (BID) | ORAL | Status: DC
  Administered 2022-10-13 – 2022-10-15 (×4): 40 mg via ORAL
  Filled 2022-10-13 (×4): qty 1

## 2022-10-13 NOTE — Progress Notes (Signed)
GI Inpatient Follow-up Note  Subjective:  Patient seen and is pleasant but demented.  Scheduled Inpatient Medications:   cholecalciferol  500 Units Oral Daily   fluticasone furoate-vilanterol  1 puff Inhalation Daily   insulin aspart  0-15 Units Subcutaneous TID WC   insulin aspart  0-5 Units Subcutaneous QHS   metoprolol tartrate  12.5 mg Oral BID   pantoprazole (PROTONIX) IV  40 mg Intravenous Q12H   potassium chloride  40 mEq Oral Q2H   sodium chloride flush  3 mL Intravenous Q12H    Continuous Inpatient Infusions:    sodium chloride 75 mL/hr at 10/13/22 0040   amiodarone 30 mg/hr (10/13/22 0039)   ampicillin-sulbactam (UNASYN) IV 3 g (10/13/22 0323)    PRN Inpatient Medications:  acetaminophen **OR** acetaminophen, albuterol, diltiazem, LORazepam, morphine injection, naphazoline-glycerin, traZODone  Review of Systems:  Unable to assess due to mentals status   Physical Examination: BP (!) 93/58 (BP Location: Right Arm)   Pulse 64   Temp 98.3 F (36.8 C) (Oral)   Resp 18   Wt 88.4 kg   SpO2 100%   BMI 38.06 kg/m  Gen: NAD, alert and oriented x 4 HEENT: PEERLA, EOMI, Neck: supple, no JVD or thyromegaly Chest: No respiratory distress Abd: soft, non-tender Ext: no edema, well perfused with 2+ pulses, Skin: no rash or lesions noted Lymph: no LAD  Data: Lab Results  Component Value Date   WBC 9.2 10/12/2022   HGB 12.5 10/12/2022   HCT 40.4 10/12/2022   MCV 107.7 (H) 10/12/2022   PLT 174 10/12/2022   Recent Labs  Lab 10/11/22 1853 10/11/22 2217 10/12/22 0529  HGB 13.0 13.1 12.5   Lab Results  Component Value Date   NA 138 10/13/2022   K 3.4 (L) 10/13/2022   CL 92 (L) 10/13/2022   CO2 34 (H) 10/13/2022   BUN 25 (H) 10/13/2022   CREATININE 0.71 10/13/2022   Lab Results  Component Value Date   ALT 11 10/11/2022   AST 38 10/11/2022   ALKPHOS 47 10/11/2022   BILITOT 1.5 (H) 10/11/2022   Recent Labs  Lab 10/11/22 2056 10/12/22 0107  APTT  --   31  INR 1.2  --    Assessment/Plan: Ms. Glaab is a 89 y.o. y/o lady with history of CHF, COPD on 02, DM II, and dementia who is here for possible small volume hematochezia and also noted to have diverticulitis. Hemoglobin stable but none checked this morning. No plans for endoluminal evaluation at this time  Recommendations:  - continue treatment of diverticulitis per primary team - continue PPI, can transition to oral BID - continue bowel regimen - consider palliative care evaluation - will likely follow peripherally, please call on call GI physician starting 7/4 through 7/7 if any issues or questions  Merlyn Lot MD, MPH [facility]

## 2022-10-13 NOTE — Progress Notes (Addendum)
Progress Note    Sheena Long  ZOX:[id number] DOB: May 13, 1931  DOA: 10/11/2022 PCP: Precious Reel, NP (Inactive)      Brief Narrative:    Medical records reviewed and are as summarized below:  Sheena Long is a 89 y.o. female with past medical history of dementia, COPD, CHF, and chronic hypoxic respiratory failure on 4 L nasal cannula, who presented to the hospital with rectal bleeding, general weakness and a fall at home.  Her home health nurse noticed that she had blood in her stool. She was hypotensive in the ED with initial blood pressure of 95/55.     Assessment/Plan:   Principal Problem:   GI bleeding Active Problems:   Sleep apnea, obstructive   Atrial fibrillation with RVR (HCC)   Hypokalemia   AKI (acute kidney injury) (HCC)   Acute diverticulitis    Body mass index is 38.06 kg/m.  (Obesity)    Abdominal pain, rectal bleeding, extensive diverticulosis, distal colonic diverticulitis: Continue IV Unasyn.  Analgesics as needed for pain.  Change IV to oral Protonix. Appreciate input from gastroenterologist.   Atrial fibrillation with RVR: Heart rate is better.  She is on oral metoprolol and IV amiodarone drip.  Follow-up with cardiologist for further recommendations.  Not a candidate for anticoagulation at this time because of rectal bleeding   Hypokalemia: Continue potassium repletion and monitor levels.   Hypotension: BP is better.   AKI: Improved   Other comorbidities include dementia, COPD, chronic diastolic CHF, chronic hypoxic respiratory failure on 4 L/min oxygen    Diet Order             Diet clear liquid Room service appropriate? Yes; Fluid consistency: Thin  Diet effective now                            Consultants: Gastroenterologist  Procedures: None    Medications:    cholecalciferol  500 Units Oral Daily   fluticasone furoate-vilanterol  1 puff Inhalation Daily   insulin aspart  0-15 Units  Subcutaneous TID WC   insulin aspart  0-5 Units Subcutaneous QHS   metoprolol tartrate  12.5 mg Oral BID   pantoprazole  40 mg Oral BID   sodium chloride flush  3 mL Intravenous Q12H   Continuous Infusions:  amiodarone 30 mg/hr (10/13/22 1356)   ampicillin-sulbactam (UNASYN) IV 3 g (10/13/22 0934)     Anti-infectives (From admission, onward)    Start     Dose/Rate Route Frequency Ordered Stop   10/12/22 1600  Ampicillin-Sulbactam (UNASYN) 3 g in sodium chloride 0.9 % 100 mL IVPB        3 g 200 mL/hr over 30 Minutes Intravenous Every 6 hours 10/12/22 1519                Family Communication/Anticipated D/C date and plan/Code Status   DVT prophylaxis: SCDs Start: 10/11/22 2330     Code Status: DNR  Family Communication: None Disposition Plan: Plan to discharge home with home health in 2 to 3 days   Status is: Inpatient Remains inpatient appropriate because: Diverticulitis, atrial fibrillation       Subjective:   Interval events noted.  She is confused and cannot provide an adequate history.  Chart review showed that she had black/brown stools this morning.  Objective:    Vitals:   10/13/22 0326 10/13/22 0806 10/13/22 1153 10/13/22 1554  BP: (!) 104/59 (!) 93/58 Marland Kitchen)  101/59 111/62  Pulse: (!) 51 64 (!) 57 (!) 109  Resp: 18 18 18 18   Temp: 98 F (36.7 C) 98.3 F (36.8 C) 97.9 F (36.6 C) 97.7 F (36.5 C)  TempSrc: Oral Oral    SpO2: 100% 100% 100% 94%  Weight: 88.4 kg      No data found.   Intake/Output Summary (Last 24 hours) at 10/13/2022 1610 Last data filed at 10/13/2022 1558 Gross per 24 hour  Intake 120 ml  Output 850 ml  Net -730 ml   Filed Weights   10/12/22 1517 10/13/22 0326  Weight: 86.2 kg 88.4 kg    Exam:  GEN: NAD SKIN: Warm and dry EYES: No pallor or icterus ENT: MMM CV: Irregular rate and rhythm PULM: CTA B ABD: soft, ND, NT, +BS CNS: AAO x 1 (person), non focal EXT: No edema or tenderness        Data  Reviewed:   I have personally reviewed following labs and imaging studies:  Labs: Labs show the following:   Basic Metabolic Panel: Recent Labs  Lab 10/11/22 1853 10/11/22 2217 10/12/22 0107 10/12/22 0529 10/13/22 0502  NA 137  --   --  136 138  K 2.9*  --    < > 3.2* 3.4*  CL 84*  --   --  91* 92*  CO2 39*  --   --  38* 34*  GLUCOSE 111*  --   --  104* 79  BUN 44*  --   --  39* 25*  CREATININE 1.01*  --   --  0.79 0.71  CALCIUM 8.3*  --   --  7.4* 7.6*  MG  --  2.0  --   --  2.0   < > = values in this interval not displayed.   GFR Estimated Creatinine Clearance: 47.2 mL/min (by C-G formula based on SCr of 0.71 mg/dL). Liver Function Tests: Recent Labs  Lab 10/11/22 1853  AST 38  ALT 11  ALKPHOS 47  BILITOT 1.5*  PROT 6.9  ALBUMIN 3.1*   No results for input(s): "LIPASE", "AMYLASE" in the last 168 hours. No results for input(s): "AMMONIA" in the last 168 hours. Coagulation profile Recent Labs  Lab 10/11/22 2056  INR 1.2    CBC: Recent Labs  Lab 10/11/22 1853 10/11/22 2217 10/12/22 0529  WBC 9.2  --  9.2  HGB 13.0 13.1 12.5  HCT 41.0 41.8 40.4  MCV 105.1*  --  107.7*  PLT 178  --  174   Cardiac Enzymes: No results for input(s): "CKTOTAL", "CKMB", "CKMBINDEX", "TROPONINI" in the last 168 hours. BNP (last 3 results) No results for input(s): "PROBNP" in the last 8760 hours. CBG: Recent Labs  Lab 10/12/22 1734 10/12/22 2101 10/13/22 0804 10/13/22 1154 10/13/22 1556  GLUCAP 167* 124* 102* 106* 118*   D-Dimer: No results for input(s): "DDIMER" in the last 72 hours. Hgb A1c: Recent Labs    10/12/22 0107  HGBA1C 5.4   Lipid Profile: No results for input(s): "CHOL", "HDL", "LDLCALC", "TRIG", "CHOLHDL", "LDLDIRECT" in the last 72 hours. Thyroid function studies: Recent Labs    10/12/22 1103  TSH 4.380  T4TOTAL 4.8   Anemia work up: No results for input(s): "VITAMINB12", "FOLATE", "FERRITIN", "TIBC", "IRON", "RETICCTPCT" in the last 72  hours. Sepsis Labs: Recent Labs  Lab 10/11/22 1853 10/12/22 0529  WBC 9.2 9.2    Microbiology No results found for this or any previous visit (from the past 240 hour(s)).  Procedures  and diagnostic studies:  ECHOCARDIOGRAM COMPLETE  Result Date: 10/12/2022    ECHOCARDIOGRAM REPORT   Patient Name:   Sheena Long Date of Exam: 10/12/2022 Medical Rec #:  [id number]        Height:       60.0 in Accession #:    [id number]       Weight:       185.0 lb Date of Birth:  July 28, 1931        BSA:          1.806 m Patient Age:    89 years         BP:           102/70 mmHg Patient Gender: F                HR:           102 bpm. Exam Location:  [facility] Procedure: 2D Echo, Cardiac Doppler and Color Doppler Indications:     Atrial Fibrillation  History:         Patient has no prior history of Echocardiogram examinations.                  Arrythmias:Atrial Fibrillation; Risk Factors:Sleep Apnea.  Sonographer:     Mikki Harbor Referring Phys:  [id number] [facility] GOEL Diagnosing Phys: Yvonne Kendall MD  Sonographer Comments: Image acquisition challenging due to uncooperative patient. IMPRESSIONS  1. Left ventricular ejection fraction, by estimation, is 50 to 55%. The left ventricle has low normal function. Left ventricular endocardial border not optimally defined to evaluate regional wall motion. There is moderate left ventricular hypertrophy. Left ventricular diastolic function could not be evaluated.  2. Right ventricular systolic function is mildly reduced. The right ventricular size is moderately enlarged. There is mildly elevated pulmonary artery systolic pressure.  3. Left atrial size was mildly dilated.  4. Right atrial size was severely dilated.  5. The mitral valve is degenerative. Trivial mitral valve regurgitation.  6. Tricuspid valve regurgitation is mild to moderate.  7. The aortic valve has an indeterminant number of cusps. Aortic valve regurgitation is not visualized. No aortic stenosis is present.  8. Aortic  dilatation noted. There is borderline dilatation of the aortic root, measuring 39 mm.  9. The inferior vena cava is dilated in size with <50% respiratory variability, suggesting right atrial pressure of 15 mmHg. FINDINGS  Left Ventricle: Left ventricular ejection fraction, by estimation, is 50 to 55%. The left ventricle has low normal function. Left ventricular endocardial border not optimally defined to evaluate regional wall motion. The left ventricular internal cavity  size was normal in size. There is moderate left ventricular hypertrophy. Left ventricular diastolic function could not be evaluated due to atrial fibrillation. Left ventricular diastolic function could not be evaluated. Right Ventricle: The right ventricular size is moderately enlarged. No increase in right ventricular wall thickness. Right ventricular systolic function is mildly reduced. There is mildly elevated pulmonary artery systolic pressure. The tricuspid regurgitant velocity is 2.51 m/s, and with an assumed right atrial pressure of 15 mmHg, the estimated right ventricular systolic pressure is 40.2 mmHg. Left Atrium: Left atrial size was mildly dilated. Right Atrium: Right atrial size was severely dilated. Pericardium: There is no evidence of pericardial effusion. Mitral Valve: The mitral valve is degenerative in appearance. There is mild thickening of the mitral valve leaflet(s). Mild to moderate mitral annular calcification. Trivial mitral valve regurgitation. MV peak gradient, 6.9 mmHg. The mean mitral valve gradient is 1.0 mmHg. Tricuspid  Valve: The tricuspid valve is normal in structure. Tricuspid valve regurgitation is mild to moderate. Aortic Valve: The aortic valve has an indeterminant number of cusps. Aortic valve regurgitation is not visualized. No aortic stenosis is present. Aortic valve mean gradient measures 3.5 mmHg. Aortic valve peak gradient measures 7.0 mmHg. Aortic valve area, by VTI measures 1.43 cm. Pulmonic Valve: The  pulmonic valve was not well visualized. Pulmonic valve regurgitation is mild. No evidence of pulmonic stenosis. Aorta: Aortic dilatation noted. There is borderline dilatation of the aortic root, measuring 39 mm. Venous: The inferior vena cava is dilated in size with less than 50% respiratory variability, suggesting right atrial pressure of 15 mmHg. IAS/Shunts: The interatrial septum was not well visualized.  LEFT VENTRICLE PLAX 2D LVIDd:         4.90 cm LVIDs:         2.20 cm LV PW:         1.40 cm LV IVS:        1.40 cm LVOT diam:     2.00 cm LV SV:         35 LV SV Index:   19 LVOT Area:     3.14 cm  RIGHT VENTRICLE RV Basal diam:  4.50 cm RV Mid diam:    3.60 cm LEFT ATRIUM           Index        RIGHT ATRIUM           Index LA diam:      3.50 cm 1.94 cm/m   RA Area:     26.60 cm LA Vol (A4C): 59.1 ml 32.73 ml/m  RA Volume:   97.90 ml  54.22 ml/m  AORTIC VALVE                    PULMONIC VALVE AV Area (Vmax):    1.80 cm     PV Vmax:       1.15 m/s AV Area (Vmean):   1.78 cm     PV Peak grad:  5.3 mmHg AV Area (VTI):     1.43 cm AV Vmax:           132.50 cm/s AV Vmean:          85.800 cm/s AV VTI:            0.244 m AV Peak Grad:      7.0 mmHg AV Mean Grad:      3.5 mmHg LVOT Vmax:         75.80 cm/s LVOT Vmean:        48.700 cm/s LVOT VTI:          0.111 m LVOT/AV VTI ratio: 0.45  AORTA Ao Root diam: 3.90 cm MITRAL VALVE                TRICUSPID VALVE MV Area (PHT): 3.76 cm     TR Peak grad:   25.2 mmHg MV Area VTI:   1.00 cm     TR Vmax:        251.00 cm/s MV Peak grad:  6.9 mmHg MV Mean grad:  1.0 mmHg     SHUNTS MV Vmax:       1.31 m/s     Systemic VTI:  0.11 m MV Vmean:      44.8 cm/s    Systemic Diam: 2.00 cm MV Decel Time: 202 msec MV E velocity: 123.00 cm/s Yvonne Kendall MD Electronically signed  by Yvonne Kendall MD Signature Date/Time: 10/12/2022/7:09:16 PM    Final    CT ABDOMEN PELVIS W CONTRAST  Result Date: 10/12/2022 CLINICAL DATA:  Ulcerative colitis. History of new onset rectal  bleeding. EXAM: CT ABDOMEN AND PELVIS WITH CONTRAST TECHNIQUE: Multidetector CT imaging of the abdomen and pelvis was performed using the standard protocol following bolus administration of intravenous contrast. RADIATION DOSE REDUCTION: This exam was performed according to the departmental dose-optimization program which includes automated exposure control, adjustment of the mA and/or kV according to patient size and/or use of iterative reconstruction technique. CONTRAST:  OMNIPAQUE IOHEXOL 300 MG/ML  SOLN COMPARISON:  CT 09/18/2022 FINDINGS: Lower chest: Heart is enlarged. Coronary artery calcifications are seen. There are calcifications along the mitral valve annulus. There is some linear opacity lung bases likely scar or atelectasis. No pleural effusion. Hepatobiliary: Gallbladder is nondilated but has a numerous luminal stones. Slight nodular contours of the liver. Patent portal vein. Once again there are several cystic areas in the liver which are quite small and too small to completely characterize. Based on prior report recommend continued follow up evaluation when appropriate such as MRI or short-term CT follow-up in 3-6 months. Pancreas: Unremarkable. No pancreatic ductal dilatation or surrounding inflammatory changes. Spleen: Stable small splenic cystic lesion. No specific imaging follow-up. Adrenals/Urinary Tract: Right adrenal gland is preserved. Slight thickening of the left adrenal gland. Nonspecific and unchanged. 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 No specific imaging follow up, unchanged. No collecting system dilatation. The ureters have normal course and caliber extending down to the bladder. Preserved contours of the urinary bladder. Stomach/Bowel: Air-fluid level along the stomach. No oral contrast. Small bowel is nondilated. Extensive colonic diverticula identified. There is wall thickening with stranding along the distal descending colon. This was seen on the prior  examination and appears similar. This could be an area of diverticulitis. Please correlate with the history. No associated obstruction, free air or rim enhancing fluid collections. Vascular/Lymphatic: Moderate vascular calcifications are identified. Normal caliber aorta and IVC. No specific abnormal lymph node enlargement identified in the abdomen and pelvis. Reproductive: Status post hysterectomy. No adnexal masses. Other: Small fat containing left inguinal hernia. Musculoskeletal: Advanced degenerative changes of the spine and pelvis. Multilevel stenosis along the spine with bony fusion of the disc space of L2-3. Particular advanced degenerative changes of the right hip as well. Global fatty muscle atrophy. IMPRESSION: Colonic diverticulosis. Once again there is an area of wall thickening with stranding along the descending colon consistent with presumed area of diverticulitis. Recommend follow-up to confirm clearance and exclude secondary pathology. No associated obstruction, free air or fluid collections. Gallstones. As described previously there are multiple low-attenuation cystic lesions in the liver, too small to completely characterize. Probable benign cystic lesions but in principle are indeterminate as per the prior examination recommend continued follow-up with either short-term follow up CT scan in 3-6 months or MRI to further delineate as clinically appropriate. Electronically Signed   By: Karen Kays M.D.   On: 10/12/2022 10:33   DG Chest Portable 1 View  Result Date: 10/11/2022 CLINICAL DATA:  Weakness EXAM: PORTABLE CHEST 1 VIEW COMPARISON:  07/28/2022 FINDINGS: Cardiac shadow is enlarged but stable. Aortic calcifications are again seen. Lungs are well aerated bilaterally. Mild left basilar scarring is noted. No acute abnormality noted. IMPRESSION: Mild left basilar scarring Electronically Signed   By: Alcide Clever M.D.   On: 10/11/2022 21:28   CT Head Wo Contrast  Result Date:  10/11/2022  CLINICAL DATA:  Recent fall with headaches and neck pain, initial encounter EXAM: CT HEAD WITHOUT CONTRAST CT CERVICAL SPINE WITHOUT CONTRAST TECHNIQUE: Multidetector CT imaging of the head and cervical spine was performed following the standard protocol without intravenous contrast. Multiplanar CT image reconstructions of the cervical spine were also generated. RADIATION DOSE REDUCTION: This exam was performed according to the departmental dose-optimization program which includes automated exposure control, adjustment of the mA and/or kV according to patient size and/or use of iterative reconstruction technique. COMPARISON:  07/17/2022 FINDINGS: CT HEAD FINDINGS Brain: No evidence of acute infarction, hemorrhage, hydrocephalus, extra-axial collection or mass lesion/mass effect. Mild chronic white matter ischemic changes are seen. Vascular: No hyperdense vessel or unexpected calcification. Skull: Normal. Negative for fracture or focal lesion. Sinuses/Orbits: No acute finding. Other: None. CT CERVICAL SPINE FINDINGS Alignment: Mild loss of the normal cervical lordosis is noted. Skull base and vertebrae: 7 cervical segments are well visualized. Multilevel osteophytic change and facet hypertrophic changes are noted. No acute fracture or acute facet abnormality is noted. The odontoid is within normal limits. Soft tissues and spinal canal: Surrounding soft tissue structures are within normal limits. Heavy atherosclerotic calcifications are seen. Upper chest: Visualized lung apices are within normal limits. Other: None IMPRESSION: CT of the head: No acute intracranial abnormality noted. CT of the cervical spine: Multilevel degenerative change without acute abnormality. Electronically Signed   By: Alcide Clever M.D.   On: 10/11/2022 21:25   CT Cervical Spine Wo Contrast  Result Date: 10/11/2022 CLINICAL DATA:  Recent fall with headaches and neck pain, initial encounter EXAM: CT HEAD WITHOUT CONTRAST CT CERVICAL  SPINE WITHOUT CONTRAST TECHNIQUE: Multidetector CT imaging of the head and cervical spine was performed following the standard protocol without intravenous contrast. Multiplanar CT image reconstructions of the cervical spine were also generated. RADIATION DOSE REDUCTION: This exam was performed according to the departmental dose-optimization program which includes automated exposure control, adjustment of the mA and/or kV according to patient size and/or use of iterative reconstruction technique. COMPARISON:  07/17/2022 FINDINGS: CT HEAD FINDINGS Brain: No evidence of acute infarction, hemorrhage, hydrocephalus, extra-axial collection or mass lesion/mass effect. Mild chronic white matter ischemic changes are seen. Vascular: No hyperdense vessel or unexpected calcification. Skull: Normal. Negative for fracture or focal lesion. Sinuses/Orbits: No acute finding. Other: None. CT CERVICAL SPINE FINDINGS Alignment: Mild loss of the normal cervical lordosis is noted. Skull base and vertebrae: 7 cervical segments are well visualized. Multilevel osteophytic change and facet hypertrophic changes are noted. No acute fracture or acute facet abnormality is noted. The odontoid is within normal limits. Soft tissues and spinal canal: Surrounding soft tissue structures are within normal limits. Heavy atherosclerotic calcifications are seen. Upper chest: Visualized lung apices are within normal limits. Other: None IMPRESSION: CT of the head: No acute intracranial abnormality noted. CT of the cervical spine: Multilevel degenerative change without acute abnormality. Electronically Signed   By: Alcide Clever M.D.   On: 10/11/2022 21:25               LOS: 2 days   Jomari Bartnik  [facility]   Pager on [email or web address]. If 7PM-7AM, please contact night-coverage at [email or web address]     10/13/2022, 4:10 PM

## 2022-10-13 NOTE — Progress Notes (Signed)
Rounding Note    Patient Name: Sheena Long Date of Encounter: 10/13/2022  [facility] Cardiologist: [facility] End  Subjective   Laying supine in bed, no complaints Remains on amiodarone infusion, metoprolol Telemetry reviewed, rate 90-110 Notes indicating possible small volume hematochezia, diverticulitis Hemoglobin stable, no CBC this morning GI following, no plan for intervention at this time Recommendation made for PPI  Inpatient Medications    Scheduled Meds:  cholecalciferol  500 Units Oral Daily   fluticasone furoate-vilanterol  1 puff Inhalation Daily   insulin aspart  0-15 Units Subcutaneous TID WC   insulin aspart  0-5 Units Subcutaneous QHS   metoprolol tartrate  12.5 mg Oral BID   pantoprazole  40 mg Oral BID   sodium chloride flush  3 mL Intravenous Q12H   Continuous Infusions:  amiodarone 30 mg/hr (10/13/22 1356)   ampicillin-sulbactam (UNASYN) IV 3 g (10/13/22 0934)   PRN Meds: acetaminophen **OR** acetaminophen, albuterol, diltiazem, LORazepam, morphine injection, naphazoline-glycerin, traZODone   Vital Signs    Vitals:   10/13/22 0326 10/13/22 0806 10/13/22 1153 10/13/22 1554  BP: (!) 104/59 (!) 93/58 (!) 101/59 111/62  Pulse: (!) 51 64 (!) 57 (!) 109  Resp: 18 18 18 18   Temp: 98 F (36.7 C) 98.3 F (36.8 C) 97.9 F (36.6 C) 97.7 F (36.5 C)  TempSrc: Oral Oral    SpO2: 100% 100% 100% 94%  Weight: 88.4 kg       Intake/Output Summary (Last 24 hours) at 10/13/2022 1604 Last data filed at 10/13/2022 1558 Gross per 24 hour  Intake 120 ml  Output 850 ml  Net -730 ml      10/13/2022    3:26 AM 10/12/2022    3:17 PM 07/28/2022    2:10 PM  Last 3 Weights  Weight (lbs) 194 lb 14.2 oz 190 lb 185 lb  Weight (kg) 88.4 kg 86.183 kg 83.915 kg      Telemetry    Atrial fibrillation rate 90-110- Personally Reviewed  ECG     - Personally Reviewed  Physical Exam   GEN: No acute distress.   Neck: No JVD Cardiac: RRR, no murmurs,  rubs, or gallops.  Respiratory: Clear to auscultation bilaterally. GI: Soft, nontender, non-distended  MS: No edema; No deformity. Neuro:  Nonfocal  Psych: Normal affect   Labs    High Sensitivity Troponin:   Recent Labs  Lab 10/12/22 0107  TROPONINIHS 48*     Chemistry Recent Labs  Lab 10/11/22 1853 10/11/22 2217 10/12/22 0107 10/12/22 0529 10/13/22 0502  NA 137  --   --  136 138  K 2.9*  --  3.9 3.2* 3.4*  CL 84*  --   --  91* 92*  CO2 39*  --   --  38* 34*  GLUCOSE 111*  --   --  104* 79  BUN 44*  --   --  39* 25*  CREATININE 1.01*  --   --  0.79 0.71  CALCIUM 8.3*  --   --  7.4* 7.6*  MG  --  2.0  --   --  2.0  PROT 6.9  --   --   --   --   ALBUMIN 3.1*  --   --   --   --   AST 38  --   --   --   --   ALT 11  --   --   --   --   ALKPHOS 47  --   --   --   --  BILITOT 1.5*  --   --   --   --   GFRNONAA 53*  --   --  >60 >60  ANIONGAP 14  --   --  7 12    Lipids No results for input(s): "CHOL", "TRIG", "HDL", "LABVLDL", "LDLCALC", "CHOLHDL" in the last 168 hours.  Hematology Recent Labs  Lab 10/11/22 1853 10/11/22 2217 10/12/22 0529  WBC 9.2  --  9.2  RBC 3.90  --  3.75*  HGB 13.0 13.1 12.5  HCT 41.0 41.8 40.4  MCV 105.1*  --  107.7*  MCH 33.3  --  33.3  MCHC 31.7  --  30.9  RDW 13.6  --  13.6  PLT 178  --  174   Thyroid  Recent Labs  Lab 10/12/22 1103  TSH 4.380    BNPNo results for input(s): "BNP", "PROBNP" in the last 168 hours.  DDimer No results for input(s): "DDIMER" in the last 168 hours.   Radiology    ECHOCARDIOGRAM COMPLETE  Result Date: 10/12/2022    ECHOCARDIOGRAM REPORT   Patient Name:   Sheena Long Date of Exam: 10/12/2022 Medical Rec #:  [id number]        Height:       60.0 in Accession #:    [id number]       Weight:       185.0 lb Date of Birth:  June 20, 1931        BSA:          1.806 m Patient Age:    89 years         BP:           102/70 mmHg Patient Gender: F                HR:           102 bpm. Exam Location:  [facility]  Procedure: 2D Echo, Cardiac Doppler and Color Doppler Indications:     Atrial Fibrillation  History:         Patient has no prior history of Echocardiogram examinations.                  Arrythmias:Atrial Fibrillation; Risk Factors:Sleep Apnea.  Sonographer:     Mikki Harbor Referring Phys:  [id number] [facility] GOEL Diagnosing Phys: Yvonne Kendall MD  Sonographer Comments: Image acquisition challenging due to uncooperative patient. IMPRESSIONS  1. Left ventricular ejection fraction, by estimation, is 50 to 55%. The left ventricle has low normal function. Left ventricular endocardial border not optimally defined to evaluate regional wall motion. There is moderate left ventricular hypertrophy. Left ventricular diastolic function could not be evaluated.  2. Right ventricular systolic function is mildly reduced. The right ventricular size is moderately enlarged. There is mildly elevated pulmonary artery systolic pressure.  3. Left atrial size was mildly dilated.  4. Right atrial size was severely dilated.  5. The mitral valve is degenerative. Trivial mitral valve regurgitation.  6. Tricuspid valve regurgitation is mild to moderate.  7. The aortic valve has an indeterminant number of cusps. Aortic valve regurgitation is not visualized. No aortic stenosis is present.  8. Aortic dilatation noted. There is borderline dilatation of the aortic root, measuring 39 mm.  9. The inferior vena cava is dilated in size with <50% respiratory variability, suggesting right atrial pressure of 15 mmHg. FINDINGS  Left Ventricle: Left ventricular ejection fraction, by estimation, is 50 to 55%. The left ventricle has low normal function. Left ventricular endocardial border not  optimally defined to evaluate regional wall motion. The left ventricular internal cavity  size was normal in size. There is moderate left ventricular hypertrophy. Left ventricular diastolic function could not be evaluated due to atrial fibrillation. Left ventricular  diastolic function could not be evaluated. Right Ventricle: The right ventricular size is moderately enlarged. No increase in right ventricular wall thickness. Right ventricular systolic function is mildly reduced. There is mildly elevated pulmonary artery systolic pressure. The tricuspid regurgitant velocity is 2.51 m/s, and with an assumed right atrial pressure of 15 mmHg, the estimated right ventricular systolic pressure is 40.2 mmHg. Left Atrium: Left atrial size was mildly dilated. Right Atrium: Right atrial size was severely dilated. Pericardium: There is no evidence of pericardial effusion. Mitral Valve: The mitral valve is degenerative in appearance. There is mild thickening of the mitral valve leaflet(s). Mild to moderate mitral annular calcification. Trivial mitral valve regurgitation. MV peak gradient, 6.9 mmHg. The mean mitral valve gradient is 1.0 mmHg. Tricuspid Valve: The tricuspid valve is normal in structure. Tricuspid valve regurgitation is mild to moderate. Aortic Valve: The aortic valve has an indeterminant number of cusps. Aortic valve regurgitation is not visualized. No aortic stenosis is present. Aortic valve mean gradient measures 3.5 mmHg. Aortic valve peak gradient measures 7.0 mmHg. Aortic valve area, by VTI measures 1.43 cm. Pulmonic Valve: The pulmonic valve was not well visualized. Pulmonic valve regurgitation is mild. No evidence of pulmonic stenosis. Aorta: Aortic dilatation noted. There is borderline dilatation of the aortic root, measuring 39 mm. Venous: The inferior vena cava is dilated in size with less than 50% respiratory variability, suggesting right atrial pressure of 15 mmHg. IAS/Shunts: The interatrial septum was not well visualized.  LEFT VENTRICLE PLAX 2D LVIDd:         4.90 cm LVIDs:         2.20 cm LV PW:         1.40 cm LV IVS:        1.40 cm LVOT diam:     2.00 cm LV SV:         35 LV SV Index:   19 LVOT Area:     3.14 cm  RIGHT VENTRICLE RV Basal diam:  4.50 cm RV  Mid diam:    3.60 cm LEFT ATRIUM           Index        RIGHT ATRIUM           Index LA diam:      3.50 cm 1.94 cm/m   RA Area:     26.60 cm LA Vol (A4C): 59.1 ml 32.73 ml/m  RA Volume:   97.90 ml  54.22 ml/m  AORTIC VALVE                    PULMONIC VALVE AV Area (Vmax):    1.80 cm     PV Vmax:       1.15 m/s AV Area (Vmean):   1.78 cm     PV Peak grad:  5.3 mmHg AV Area (VTI):     1.43 cm AV Vmax:           132.50 cm/s AV Vmean:          85.800 cm/s AV VTI:            0.244 m AV Peak Grad:      7.0 mmHg AV Mean Grad:      3.5 mmHg LVOT Vmax:  75.80 cm/s LVOT Vmean:        48.700 cm/s LVOT VTI:          0.111 m LVOT/AV VTI ratio: 0.45  AORTA Ao Root diam: 3.90 cm MITRAL VALVE                TRICUSPID VALVE MV Area (PHT): 3.76 cm     TR Peak grad:   25.2 mmHg MV Area VTI:   1.00 cm     TR Vmax:        251.00 cm/s MV Peak grad:  6.9 mmHg MV Mean grad:  1.0 mmHg     SHUNTS MV Vmax:       1.31 m/s     Systemic VTI:  0.11 m MV Vmean:      44.8 cm/s    Systemic Diam: 2.00 cm MV Decel Time: 202 msec MV E velocity: 123.00 cm/s Yvonne Kendall MD Electronically signed by Yvonne Kendall MD Signature Date/Time: 10/12/2022/7:09:16 PM    Final    CT ABDOMEN PELVIS W CONTRAST  Result Date: 10/12/2022 CLINICAL DATA:  Ulcerative colitis. History of new onset rectal bleeding. EXAM: CT ABDOMEN AND PELVIS WITH CONTRAST TECHNIQUE: Multidetector CT imaging of the abdomen and pelvis was performed using the standard protocol following bolus administration of intravenous contrast. RADIATION DOSE REDUCTION: This exam was performed according to the departmental dose-optimization program which includes automated exposure control, adjustment of the mA and/or kV according to patient size and/or use of iterative reconstruction technique. CONTRAST:  OMNIPAQUE IOHEXOL 300 MG/ML  SOLN COMPARISON:  CT 09/18/2022 FINDINGS: Lower chest: Heart is enlarged. Coronary artery calcifications are seen. There are calcifications along  the mitral valve annulus. There is some linear opacity lung bases likely scar or atelectasis. No pleural effusion. Hepatobiliary: Gallbladder is nondilated but has a numerous luminal stones. Slight nodular contours of the liver. Patent portal vein. Once again there are several cystic areas in the liver which are quite small and too small to completely characterize. Based on prior report recommend continued follow up evaluation when appropriate such as MRI or short-term CT follow-up in 3-6 months. Pancreas: Unremarkable. No pancreatic ductal dilatation or surrounding inflammatory changes. Spleen: Stable small splenic cystic lesion. No specific imaging follow-up. Adrenals/Urinary Tract: Right adrenal gland is preserved. Slight thickening of the left adrenal gland. Nonspecific and unchanged. 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 No specific imaging follow up, unchanged. No collecting system dilatation. The ureters have normal course and caliber extending down to the bladder. Preserved contours of the urinary bladder. Stomach/Bowel: Air-fluid level along the stomach. No oral contrast. Small bowel is nondilated. Extensive colonic diverticula identified. There is wall thickening with stranding along the distal descending colon. This was seen on the prior examination and appears similar. This could be an area of diverticulitis. Please correlate with the history. No associated obstruction, free air or rim enhancing fluid collections. Vascular/Lymphatic: Moderate vascular calcifications are identified. Normal caliber aorta and IVC. No specific abnormal lymph node enlargement identified in the abdomen and pelvis. Reproductive: Status post hysterectomy. No adnexal masses. Other: Small fat containing left inguinal hernia. Musculoskeletal: Advanced degenerative changes of the spine and pelvis. Multilevel stenosis along the spine with bony fusion of the disc space of L2-3. Particular advanced degenerative changes of  the right hip as well. Global fatty muscle atrophy. IMPRESSION: Colonic diverticulosis. Once again there is an area of wall thickening with stranding along the descending colon consistent with presumed area of diverticulitis. Recommend follow-up to  confirm clearance and exclude secondary pathology. No associated obstruction, free air or fluid collections. Gallstones. As described previously there are multiple low-attenuation cystic lesions in the liver, too small to completely characterize. Probable benign cystic lesions but in principle are indeterminate as per the prior examination recommend continued follow-up with either short-term follow up CT scan in 3-6 months or MRI to further delineate as clinically appropriate. Electronically Signed   By: Karen Kays M.D.   On: 10/12/2022 10:33   DG Chest Portable 1 View  Result Date: 10/11/2022 CLINICAL DATA:  Weakness EXAM: PORTABLE CHEST 1 VIEW COMPARISON:  07/28/2022 FINDINGS: Cardiac shadow is enlarged but stable. Aortic calcifications are again seen. Lungs are well aerated bilaterally. Mild left basilar scarring is noted. No acute abnormality noted. IMPRESSION: Mild left basilar scarring Electronically Signed   By: Alcide Clever M.D.   On: 10/11/2022 21:28   CT Head Wo Contrast  Result Date: 10/11/2022 CLINICAL DATA:  Recent fall with headaches and neck pain, initial encounter EXAM: CT HEAD WITHOUT CONTRAST CT CERVICAL SPINE WITHOUT CONTRAST TECHNIQUE: Multidetector CT imaging of the head and cervical spine was performed following the standard protocol without intravenous contrast. Multiplanar CT image reconstructions of the cervical spine were also generated. RADIATION DOSE REDUCTION: This exam was performed according to the departmental dose-optimization program which includes automated exposure control, adjustment of the mA and/or kV according to patient size and/or use of iterative reconstruction technique. COMPARISON:  07/17/2022 FINDINGS: CT HEAD FINDINGS  Brain: No evidence of acute infarction, hemorrhage, hydrocephalus, extra-axial collection or mass lesion/mass effect. Mild chronic white matter ischemic changes are seen. Vascular: No hyperdense vessel or unexpected calcification. Skull: Normal. Negative for fracture or focal lesion. Sinuses/Orbits: No acute finding. Other: None. CT CERVICAL SPINE FINDINGS Alignment: Mild loss of the normal cervical lordosis is noted. Skull base and vertebrae: 7 cervical segments are well visualized. Multilevel osteophytic change and facet hypertrophic changes are noted. No acute fracture or acute facet abnormality is noted. The odontoid is within normal limits. Soft tissues and spinal canal: Surrounding soft tissue structures are within normal limits. Heavy atherosclerotic calcifications are seen. Upper chest: Visualized lung apices are within normal limits. Other: None IMPRESSION: CT of the head: No acute intracranial abnormality noted. CT of the cervical spine: Multilevel degenerative change without acute abnormality. Electronically Signed   By: Alcide Clever M.D.   On: 10/11/2022 21:25   CT Cervical Spine Wo Contrast  Result Date: 10/11/2022 CLINICAL DATA:  Recent fall with headaches and neck pain, initial encounter EXAM: CT HEAD WITHOUT CONTRAST CT CERVICAL SPINE WITHOUT CONTRAST TECHNIQUE: Multidetector CT imaging of the head and cervical spine was performed following the standard protocol without intravenous contrast. Multiplanar CT image reconstructions of the cervical spine were also generated. RADIATION DOSE REDUCTION: This exam was performed according to the departmental dose-optimization program which includes automated exposure control, adjustment of the mA and/or kV according to patient size and/or use of iterative reconstruction technique. COMPARISON:  07/17/2022 FINDINGS: CT HEAD FINDINGS Brain: No evidence of acute infarction, hemorrhage, hydrocephalus, extra-axial collection or mass lesion/mass effect. Mild  chronic white matter ischemic changes are seen. Vascular: No hyperdense vessel or unexpected calcification. Skull: Normal. Negative for fracture or focal lesion. Sinuses/Orbits: No acute finding. Other: None. CT CERVICAL SPINE FINDINGS Alignment: Mild loss of the normal cervical lordosis is noted. Skull base and vertebrae: 7 cervical segments are well visualized. Multilevel osteophytic change and facet hypertrophic changes are noted. No acute fracture or acute facet abnormality is noted. The  odontoid is within normal limits. Soft tissues and spinal canal: Surrounding soft tissue structures are within normal limits. Heavy atherosclerotic calcifications are seen. Upper chest: Visualized lung apices are within normal limits. Other: None IMPRESSION: CT of the head: No acute intracranial abnormality noted. CT of the cervical spine: Multilevel degenerative change without acute abnormality. Electronically Signed   By: Alcide Clever M.D.   On: 10/11/2022 21:25    Cardiac Studies   Echo  1. Left ventricular ejection fraction, by estimation, is 50 to 55%. The  left ventricle has low normal function. Left ventricular endocardial  border not optimally defined to evaluate regional wall motion. There is  moderate left ventricular hypertrophy.  Left ventricular diastolic function could not be evaluated.   2. Right ventricular systolic function is mildly reduced. The right  ventricular size is moderately enlarged. There is mildly elevated  pulmonary artery systolic pressure.   3. Left atrial size was mildly dilated.   4. Right atrial size was severely dilated.   5. The mitral valve is degenerative. Trivial mitral valve regurgitation.   6. Tricuspid valve regurgitation is mild to moderate.   7. The aortic valve has an indeterminant number of cusps. Aortic valve  regurgitation is not visualized. No aortic stenosis is present.   8. Aortic dilatation noted. There is borderline dilatation of the aortic  root,  measuring 39 mm.   9. The inferior vena cava is dilated in size with <50% respiratory  variability, suggesting right atrial pressure of 15 mmHg.   Patient Profile     Sheena Long is a 89 y.o. female with a hx of moderate dementia, chronic diastolic congestive heart failure, obesity, COPD, benign essential tremor, osteoarthritis of multiple joints, obstructive sleep apnea, venous insufficiency, peripheral neuropathy, depression, dyslipidemia, and prediabetes, former smoker who is being seen 10/12/2022 for the evaluation of atrial fibrillation RVR   Assessment & Plan    Atrial fibrillation with RVR Arrhythmia noted on arrival" -Found to be in atrial fibrillation RVR on arrival Low normal ejection fraction  on amiodarone infusion and metoprolol 12.5 twice daily Unable to titrate on metoprolol given low blood pressure -Not an ideal candidate for anticoagulation due to recurrent falls -CHA2DS2-VASc score of at least 6 Currently appears asymptomatic from her atrial fibrillation  GI bleeding -Hemoglobin has remained stable GI following, no plan for intervention CT of the abdomen shows distal colonic diverticulitis and extensive diverticulosis   Hypokalemia Appears to have been repleted today   Obstructive sleep apnea -Patient recommended to sleep with CPAP nightly but she has declined -Per family she does not use device at home as recommended   History of dementia/cognitive decline -History and account of events leading to hospitalization given by nephew and granddaughter     Total encounter time more than 35 minutes  Greater than 50% was spent in counseling and coordination of care with the patient   For questions or updates, please contact [facility] Please consult [email or web address] for contact info under        Signed, Julien Nordmann, MD  10/13/2022, 4:04 PM

## 2022-10-13 NOTE — Evaluation (Signed)
Occupational Therapy Evaluation Patient Details Name: Sheena Long MRN: [id number] DOB: [birth year] Today's Date: 10/13/2022   History of Present Illness Pt is an 89 year old admitted with GI bleed, a fib with RVR new onset, hypotension; hypokalemia; AKI; PMH significant for dementia, COPD, CHF, and chronic hypoxic respiratory failure on 3 L nasal cannula   Clinical Impression   Chart reviewed, pt greeted on bsc, agreeable to OT evaluation. PTA Pt lives alone but reports she has assistance for ADL/IADL as needed. Pt presents with deficits in strength, endurance, activity tolerance, balance affecting safe and optimal ADL completion. OT will follow acutely to facilitate return to PLOF and improve ADL status.      Recommendations for follow up therapy are one component of a multi-disciplinary discharge planning process, led by the attending physician.  Recommendations may be updated based on patient status, additional functional criteria and insurance authorization.   Assistance Recommended at Discharge Frequent or constant Supervision/Assistance  Patient can return home with the following A lot of help with walking and/or transfers;A lot of help with bathing/dressing/bathroom    Functional Status Assessment  Patient has had a recent decline in their functional status and demonstrates the ability to make significant improvements in function in a reasonable and predictable amount of time.  Equipment Recommendations  BSC/3in1    Recommendations for Other Services       Precautions / Restrictions Precautions Precautions: Fall Restrictions Weight Bearing Restrictions: No      Mobility Bed Mobility               General bed mobility comments: NT on bsc pre session/chair post session    Transfers Overall transfer level: Needs assistance Equipment used: Rolling walker (2 wheels) Transfers: Sit to/from Stand Sit to Stand: Mod assist (multiple attempts)                   Balance Overall balance assessment: Needs assistance Sitting-balance support: Bilateral upper extremity supported, Feet supported Sitting balance-Leahy Scale: Fair     Standing balance support: Bilateral upper extremity supported, Reliant on assistive device for balance Standing balance-Leahy Scale: Poor                             ADL either performed or assessed with clinical judgement   ADL Overall ADL's : Needs assistance/impaired     Grooming: Wash/dry hands;Sitting;Min guard       Lower Body Bathing: Maximal assistance;Sitting/lateral leans   Upper Body Dressing : Moderate assistance   Lower Body Dressing: Maximal assistance   Toilet Transfer: Minimal assistance;Rolling walker (2 wheels);BSC/3in1;Cueing for sequencing;Cueing for safety;+2 for safety/equipment   Toileting- Clothing Manipulation and Hygiene: Maximal assistance;Sitting/lateral lean Toileting - Clothing Manipulation Details (indicate cue type and reason): pt unable to mantain standing to wipe after continent BM             Vision Patient Visual Report: No change from baseline       Perception     Praxis      Pertinent Vitals/Pain Pain Assessment Pain Assessment: No/denies pain     Hand Dominance     Extremity/Trunk Assessment Upper Extremity Assessment Upper Extremity Assessment: Generalized weakness   Lower Extremity Assessment Lower Extremity Assessment: Generalized weakness   Cervical / Trunk Assessment Cervical / Trunk Assessment: Normal   Communication Communication Communication: No difficulties   Cognition Arousal/Alertness: Awake/alert Behavior During Therapy: WFL for tasks assessed/performed Overall Cognitive Status: No family/caregiver present  to determine baseline cognitive functioning Area of Impairment: Orientation, Attention, Memory, Following commands, Safety/judgement, Awareness, Problem solving                 Orientation Level: Disoriented to,  Time, Situation Current Attention Level: Sustained Memory: Decreased short-term memory Following Commands: Follows one step commands with increased time Safety/Judgement: Decreased awareness of deficits Awareness: Emergent Problem Solving: Slow processing, Requires verbal cues, Requires tactile cues       General Comments       Exercises     Shoulder Instructions      Home Living Family/patient expects to be discharged to:: Private residence Living Arrangements: Alone Available Help at Discharge: Family;Available PRN/intermittently (has an aid during the day per pt report) Type of Home: House Home Access: Ramped entrance     Home Layout: One level     Bathroom Shower/Tub: Chief Strategy Officer: Standard     Home Equipment: Agricultural consultant (2 wheels)   Additional Comments: info taken from chart      Prior Functioning/Environment Prior Level of Function : Needs assist       Physical Assist : Mobility (physical) Mobility (physical): Stairs;Gait ADLs (physical): Grooming;Bathing;Dressing;Toileting;IADLs Mobility Comments: reports using rollator at baseline ADLs Comments: pt reports she requires some assist for dressing, can wipe herself onthe toilet; assist for IADLs        OT Problem List: Decreased strength;Decreased activity tolerance;Decreased knowledge of use of DME or AE;Decreased safety awareness;Decreased knowledge of precautions;Impaired balance (sitting and/or standing)      OT Treatment/Interventions: Self-care/ADL training;DME and/or AE instruction;Therapeutic activities;Balance training;Therapeutic exercise;Patient/family education    OT Goals(Current goals can be found in the care plan section) Acute Rehab OT Goals Patient Stated Goal: get home OT Goal Formulation: With patient Time For Goal Achievement: 10/27/22 Potential to Achieve Goals: Good ADL Goals Pt Will Perform Grooming: with supervision;sitting Pt Will Perform Lower Body  Dressing: with min assist Pt Will Transfer to Toilet: with min assist Pt Will Perform Toileting - Clothing Manipulation and hygiene: with min assist  OT Frequency: Min 2X/week    Co-evaluation              AM-PAC OT "6 Clicks" Daily Activity     Outcome Measure Help from another person eating meals?: None Help from another person taking care of personal grooming?: None Help from another person toileting, which includes using toliet, bedpan, or urinal?: A Lot Help from another person bathing (including washing, rinsing, drying)?: A Lot Help from another person to put on and taking off regular upper body clothing?: A Little Help from another person to put on and taking off regular lower body clothing?: A Lot 6 Click Score: 17   End of Session Equipment Utilized During Treatment: Gait belt;Rolling walker (2 wheels) Nurse Communication: Mobility status  Activity Tolerance: Patient tolerated treatment well Patient left: in chair;with call bell/phone within reach;with chair alarm set  OT Visit Diagnosis: Unsteadiness on feet (R26.81);Muscle weakness (generalized) (M62.81)                Time: 1039-1100 OT Time Calculation (min): 21 min Charges:  OT General Charges $OT Visit: 1 Visit OT Evaluation $OT Eval Moderate Complexity: 1 Mod  Oleta Mouse, OTD OTR/L  10/13/22, 1:28 PM

## 2022-10-13 NOTE — Evaluation (Addendum)
Physical Therapy Evaluation Patient Details Name: Sheena Long MRN: [id number] DOB: 07-05-1931 Today's Date: 10/13/2022  History of Present Illness  Pt is a 89 y/o F who presents with rectal bleeding, diagnosed with a-fib + tachycardia, GI bleed into intrabdominal infection. PMH significant for dementia, CHF, COPD, OSA, dyslipidemia, peripheral neuropathy  Clinical Impression  Pt in bed with nursing, oriented to self and day of week, denies any pain. PTA pt was living alone with a HHA coming 3-4x week for help with meals, bathing, dressing and son visits daily, modI with mobility using rollator. Pt able to complete bed mobility with modA for trunk control and LE facilitation, cueing for sequencing. Pt performed transfers c/ maxA RW and cueing for sequencing, ambulating ~2 feet with modA for lift and steadying and cueing for sequencing. BP in supine at rest 90/48, seated EOB 111/77, no reported symptoms. Pt left in chair, needs within reach, alarm set. Pt would benefit from skilled PT interventions to improve mobility and functional activity capacity.       Assistance Recommended at Discharge Frequent or constant Supervision/Assistance  If plan is discharge home, recommend the following:  Can travel by private vehicle  A lot of help with walking and/or transfers;A lot of help with bathing/dressing/bathroom;Assistance with cooking/housework;Assist for transportation;Help with stairs or ramp for entrance;Direct supervision/assist for medications management;Direct supervision/assist for financial management   No    Equipment Recommendations Rolling walker (2 wheels);Other (comment) (RW if pt does not have one)  Recommendations for Other Services       Functional Status Assessment Patient has had a recent decline in their functional status and demonstrates the ability to make significant improvements in function in a reasonable and predictable amount of time.     Precautions / Restrictions  Precautions Precautions: Fall Restrictions Weight Bearing Restrictions: No      Mobility  Bed Mobility Overal bed mobility: Needs Assistance Bed Mobility: Supine to Sit     Supine to sit: HOB elevated, Mod assist     General bed mobility comments: pt able to complete portion of mvmt, needs help with trunk control and LE facilitation    Transfers Overall transfer level: Needs assistance Equipment used: Rolling walker (2 wheels) Transfers: Sit to/from Stand Sit to Stand: Max assist           General transfer comment: maxA for initial lift, requires modA for steadying    Ambulation/Gait Ambulation/Gait assistance: Mod assist Gait Distance (Feet): 2 Feet Assistive device: Rolling walker (2 wheels) Gait Pattern/deviations: Step-to pattern Gait velocity: decreased     General Gait Details: sidestepping from EOB>chair, modA for steadying and support during weight shifting  Stairs            Wheelchair Mobility     Tilt Bed    Modified Rankin (Stroke Patients Only)       Balance Overall balance assessment: Needs assistance Sitting-balance support: Bilateral upper extremity supported, Feet supported Sitting balance-Leahy Scale: Fair     Standing balance support: Bilateral upper extremity supported, Reliant on assistive device for balance Standing balance-Leahy Scale: Poor Standing balance comment: pt requires modA for support                             Pertinent Vitals/Pain Pain Assessment Pain Assessment: No/denies pain    Home Living Family/patient expects to be discharged to:: Private residence Living Arrangements: Alone Available Help at Discharge: Family;Available PRN/intermittently Type of Home: House Home Access:  Ramped entrance       Home Layout: One level Home Equipment: Agricultural consultant (2 wheels)      Prior Function Prior Level of Function : Needs assist       Physical Assist : ADLs (physical)   ADLs (physical):  Grooming;Bathing;Dressing;Toileting;IADLs Mobility Comments: reports using rollator at baseline ADLs Comments: has aide help with ADL's 3-4x a week     Hand Dominance        Extremity/Trunk Assessment   Upper Extremity Assessment Upper Extremity Assessment: Generalized weakness    Lower Extremity Assessment Lower Extremity Assessment: Generalized weakness       Communication   Communication: No difficulties  Cognition Arousal/Alertness: Awake/alert Behavior During Therapy: WFL for tasks assessed/performed Overall Cognitive Status: No family/caregiver present to determine baseline cognitive functioning                                 General Comments: oriented to self only, and day of week        General Comments      Exercises     Assessment/Plan    PT Assessment Patient needs continued PT services  PT Problem List Decreased strength;Decreased cognition;Decreased range of motion;Decreased knowledge of use of DME;Decreased activity tolerance;Decreased safety awareness;Decreased balance;Decreased mobility;Decreased coordination       PT Treatment Interventions DME instruction;Balance training;Gait training;Neuromuscular re-education;Stair training;Functional mobility training;Patient/family education;Therapeutic activities;Therapeutic exercise    PT Goals (Current goals can be found in the Care Plan section)  Acute Rehab PT Goals Patient Stated Goal: to return to home PT Goal Formulation: With patient Time For Goal Achievement: 10/27/22 Potential to Achieve Goals: Good    Frequency Min 3X/week     Co-evaluation               AM-PAC PT "6 Clicks" Mobility  Outcome Measure Help needed turning from your back to your side while in a flat bed without using bedrails?: A Lot Help needed moving from lying on your back to sitting on the side of a flat bed without using bedrails?: A Lot Help needed moving to and from a bed to a chair (including a  wheelchair)?: A Lot Help needed standing up from a chair using your arms (e.g., wheelchair or bedside chair)?: A Lot Help needed to walk in hospital room?: A Lot Help needed climbing 3-5 steps with a railing? : Total 6 Click Score: 11    End of Session Equipment Utilized During Treatment: Gait belt;Oxygen Activity Tolerance: Patient limited by fatigue Patient left: in chair;with call bell/phone within reach;with chair alarm set Nurse Communication: Mobility status PT Visit Diagnosis: Unsteadiness on feet (R26.81);Other abnormalities of gait and mobility (R26.89);Muscle weakness (generalized) (M62.81)    Time: 0933-1000 PT Time Calculation (min) (ACUTE ONLY): 27 min   Charges:   PT Evaluation $PT Eval Low Complexity: 1 Low PT Treatments $Therapeutic Activity: 8-22 mins PT General Charges $$ ACUTE PT VISIT: 1 Visit         Lala Lund, PT, SPT  12:47 PM,10/13/22

## 2022-10-13 NOTE — TOC Progression Note (Addendum)
Transition of Care [facility]) - Progression Note    Patient Details  Name: Sheena Long MRN: [id number] Date of Birth: 1931/07/18  [facility] CM/SW Contact  Margarito Liner, LCSW Phone Number: 10/13/2022, 2:13 PM  Clinical Narrative:  Left voicemail for [facility] with [facility]. Will discuss PT and OT recommendations when she calls back.   2:58 pm: CSW spoke to [employer]. She just had a family meeting with nephew Avon Gully) and son. Family wants to honor patient's wishes to remain home and are working on arranging 24/7 supervision. They are going to trial this for two weeks to see how well this plan works. [facility] and family plan to meet with patient on Friday to discuss.  Expected Discharge Plan: Home w Home Health Services    Expected Discharge Plan and Services                                               Social Determinants of Health (SDOH) Interventions SDOH Screenings   Food Insecurity: No Food Insecurity (10/12/2022)  Housing: Low Risk  (10/12/2022)  Transportation Needs: No Transportation Needs (10/12/2022)  Utilities: Not At Risk (10/12/2022)  Tobacco Use: Medium Risk (10/12/2022)    Readmission Risk Interventions     No data to display

## 2022-10-13 NOTE — Plan of Care (Signed)

## 2022-10-13 NOTE — Consult Note (Signed)
PHARMACY CONSULT NOTE - FOLLOW UP  Pharmacy Consult for Electrolyte Monitoring and Replacement   Recent Labs: Potassium (mmol/L)  Date Value  10/13/2022 3.4 (L)   Magnesium (mg/dL)  Date Value  16/01/9603 2.0   Calcium (mg/dL)  Date Value  54/12/8117 7.6 (L)   Albumin (g/dL)  Date Value  14/78/2956 3.1 (L)   Sodium (mmol/L)  Date Value  10/13/2022 138     Assessment: 89 year old female, with dementia and hx of CHF. Presented with possible GI bleed. K+ 2.9 > 3.2. Pt is on amiodarone gtt for new afib.  Holding home bumex.   Goal of Therapy:  Optimize electrolytes K >/= 4,  Mg>/= 2.   Plan: K remains below normal limits and below desired 4 mmol/L Replace with Kcl 40 mEq po q 2hr x 2 doses f/u with AM labs.   Sheena Long PharmD, BCPS 10/13/2022 7:38 AM

## 2022-10-14 DIAGNOSIS — K5791 Diverticulosis of intestine, part unspecified, without perforation or abscess with bleeding: Secondary | ICD-10-CM

## 2022-10-14 LAB — CBC
HCT: 35.9 % — ABNORMAL LOW (ref 36.0–46.0)
Hemoglobin: 11.1 g/dL — ABNORMAL LOW (ref 12.0–15.0)
MCH: 33.3 pg (ref 26.0–34.0)
MCHC: 30.9 g/dL (ref 30.0–36.0)
MCV: 107.8 fL — ABNORMAL HIGH (ref 80.0–100.0)
Platelets: 148 10*3/uL — ABNORMAL LOW (ref 150–400)
RBC: 3.33 MIL/uL — ABNORMAL LOW (ref 3.87–5.11)
RDW: 13.3 % (ref 11.5–15.5)
WBC: 7.1 10*3/uL (ref 4.0–10.5)
nRBC: 0 % (ref 0.0–0.2)

## 2022-10-14 LAB — BASIC METABOLIC PANEL
Anion gap: 12 (ref 5–15)
BUN: 14 mg/dL (ref 8–23)
CO2: 32 mmol/L (ref 22–32)
Calcium: 7.4 mg/dL — ABNORMAL LOW (ref 8.9–10.3)
Chloride: 94 mmol/L — ABNORMAL LOW (ref 98–111)
Creatinine, Ser: 0.55 mg/dL (ref 0.44–1.00)
GFR, Estimated: >60 mL/min (ref >60)
Glucose, Bld: 79 mg/dL (ref 70–99)
Potassium: 3.3 mmol/L — ABNORMAL LOW (ref 3.5–5.1)
Sodium: 138 mmol/L (ref 135–145)

## 2022-10-14 LAB — GLUCOSE, CAPILLARY
Glucose-Capillary: 74 mg/dL (ref 70–99)
Glucose-Capillary: 144 mg/dL — ABNORMAL HIGH (ref 70–99)
Glucose-Capillary: 116 mg/dL — ABNORMAL HIGH (ref 70–99)
Glucose-Capillary: 110 mg/dL — ABNORMAL HIGH (ref 70–99)

## 2022-10-14 LAB — MAGNESIUM: Magnesium: 2.1 mg/dL (ref 1.7–2.4)

## 2022-10-14 MED ORDER — METOPROLOL TARTRATE 25 MG PO TABS
25.0000 mg | ORAL_TABLET | Freq: Two times a day (BID) | ORAL | Status: DC
  Administered 2022-10-14 – 2022-10-15 (×3): 25 mg via ORAL
  Filled 2022-10-14 (×3): qty 1

## 2022-10-14 MED ORDER — FUROSEMIDE 10 MG/ML IJ SOLN
20.0000 mg | Freq: Once | INTRAMUSCULAR | Status: AC
  Administered 2022-10-14: 20 mg via INTRAVENOUS
  Filled 2022-10-14: qty 2

## 2022-10-14 MED ORDER — POTASSIUM CHLORIDE CRYS ER 20 MEQ PO TBCR
40.0000 meq | EXTENDED_RELEASE_TABLET | ORAL | Status: AC
  Administered 2022-10-14 (×2): 40 meq via ORAL
  Filled 2022-10-14 (×2): qty 2

## 2022-10-14 MED ORDER — AMOXICILLIN-POT CLAVULANATE 875-125 MG PO TABS
1.0000 | ORAL_TABLET | Freq: Two times a day (BID) | ORAL | Status: DC
  Administered 2022-10-14 – 2022-10-15 (×2): 1 via ORAL
  Filled 2022-10-14 (×2): qty 1

## 2022-10-14 MED ORDER — AMIODARONE HCL 200 MG PO TABS
400.0000 mg | ORAL_TABLET | Freq: Two times a day (BID) | ORAL | Status: DC
  Administered 2022-10-14 – 2022-10-15 (×3): 400 mg via ORAL
  Filled 2022-10-14 (×3): qty 2

## 2022-10-14 MED ORDER — POTASSIUM CHLORIDE CRYS ER 20 MEQ PO TBCR
40.0000 meq | EXTENDED_RELEASE_TABLET | Freq: Once | ORAL | Status: AC
  Administered 2022-10-14: 40 meq via ORAL
  Filled 2022-10-14: qty 2

## 2022-10-14 NOTE — Progress Notes (Signed)
Rounding Note    Patient Name: Sheena Long Date of Encounter: 10/14/2022  [facility] Cardiologist: [facility] End  Subjective   Reports feeling well, indicates that she would like to go home Reports that she has supports at home Has not been walking outside the bed Reports that she has a cane and walker at home  Telemetry reviewed, converting to normal sinus rhythm with PACs, PVCs Slight trend down in hemoglobin 11.1 down from 13  GI following, no plan for intervention at this time Recommendation made for PPI  Inpatient Medications    Scheduled Meds:  amiodarone  400 mg Oral BID   cholecalciferol  500 Units Oral Daily   fluticasone furoate-vilanterol  1 puff Inhalation Daily   furosemide  20 mg Intravenous Once   insulin aspart  0-15 Units Subcutaneous TID WC   insulin aspart  0-5 Units Subcutaneous QHS   metoprolol tartrate  25 mg Oral BID   pantoprazole  40 mg Oral BID   potassium chloride  40 mEq Oral Q4H   potassium chloride  40 mEq Oral Once   sodium chloride flush  3 mL Intravenous Q12H   Continuous Infusions:  ampicillin-sulbactam (UNASYN) IV 3 g (10/14/22 0322)   PRN Meds: acetaminophen **OR** acetaminophen, albuterol, diltiazem, LORazepam, morphine injection, naphazoline-glycerin, traZODone   Vital Signs    Vitals:   10/13/22 2348 10/14/22 0355 10/14/22 0440 10/14/22 0745  BP: 111/63 103/66  132/84  Pulse: 71 96  99  Resp: 17 20  20   Temp: 97.7 F (36.5 C) 97.8 F (36.6 C)  97.7 F (36.5 C)  TempSrc:    Oral  SpO2: 98% 98%  93%  Weight:   95 kg     Intake/Output Summary (Last 24 hours) at 10/14/2022 0949 Last data filed at 10/13/2022 1558 Gross per 24 hour  Intake 120 ml  Output 400 ml  Net -280 ml      10/14/2022    4:40 AM 10/13/2022    3:26 AM 10/12/2022    3:17 PM  Last 3 Weights  Weight (lbs) 209 lb 7 oz 194 lb 14.2 oz 190 lb  Weight (kg) 95 kg 88.4 kg 86.183 kg      Telemetry    Normal sinus rhythm rates in the 70-90  range- Personally Reviewed  ECG     - Personally Reviewed  Physical Exam   Constitutional:  oriented to person, place, and time. No distress.  HENT:  Head: Grossly normal Eyes:  no discharge. No scleral icterus.  Neck: No JVD, no carotid bruits  Cardiovascular: Regular rate and rhythm, ectopy appreciated no murmurs appreciated Pulmonary/Chest: Clear to auscultation bilaterally, no wheezes or rails Abdominal: Soft.  no distension.  no tenderness.  Musculoskeletal: Normal range of motion Neurological:  normal muscle tone. Coordination normal. No atrophy Skin: Skin warm and dry Psychiatric: normal affect, pleasant   Labs    High Sensitivity Troponin:   Recent Labs  Lab 10/12/22 0107  TROPONINIHS 48*     Chemistry Recent Labs  Lab 10/11/22 1853 10/11/22 2217 10/12/22 0107 10/12/22 0529 10/13/22 0502 10/14/22 0549  NA 137  --   --  136 138 138  K 2.9*  --    < > 3.2* 3.4* 3.3*  CL 84*  --   --  91* 92* 94*  CO2 39*  --   --  38* 34* 32  GLUCOSE 111*  --   --  104* 79 79  BUN 44*  --   --  39* 25* 14  CREATININE 1.01*  --   --  0.79 0.71 0.55  CALCIUM 8.3*  --   --  7.4* 7.6* 7.4*  MG  --  2.0  --   --  2.0 2.1  PROT 6.9  --   --   --   --   --   ALBUMIN 3.1*  --   --   --   --   --   AST 38  --   --   --   --   --   ALT 11  --   --   --   --   --   ALKPHOS 47  --   --   --   --   --   BILITOT 1.5*  --   --   --   --   --   GFRNONAA 53*  --   --  >60 >60 >60  ANIONGAP 14  --   --  7 12 12    < > = values in this interval not displayed.    Lipids No results for input(s): "CHOL", "TRIG", "HDL", "LABVLDL", "LDLCALC", "CHOLHDL" in the last 168 hours.  Hematology Recent Labs  Lab 10/11/22 1853 10/11/22 2217 10/12/22 0529 10/14/22 0549  WBC 9.2  --  9.2 7.1  RBC 3.90  --  3.75* 3.33*  HGB 13.0 13.1 12.5 11.1*  HCT 41.0 41.8 40.4 35.9*  MCV 105.1*  --  107.7* 107.8*  MCH 33.3  --  33.3 33.3  MCHC 31.7  --  30.9 30.9  RDW 13.6  --  13.6 13.3  PLT 178  --   174 148*   Thyroid  Recent Labs  Lab 10/12/22 1103  TSH 4.380    BNPNo results for input(s): "BNP", "PROBNP" in the last 168 hours.  DDimer No results for input(s): "DDIMER" in the last 168 hours.   Radiology    CT ABDOMEN PELVIS W CONTRAST  Result Date: 10/12/2022 CLINICAL DATA:  Ulcerative colitis. History of new onset rectal bleeding. EXAM: CT ABDOMEN AND PELVIS WITH CONTRAST TECHNIQUE: Multidetector CT imaging of the abdomen and pelvis was performed using the standard protocol following bolus administration of intravenous contrast. RADIATION DOSE REDUCTION: This exam was performed according to the departmental dose-optimization program which includes automated exposure control, adjustment of the mA and/or kV according to patient size and/or use of iterative reconstruction technique. CONTRAST:  OMNIPAQUE IOHEXOL 300 MG/ML  SOLN COMPARISON:  CT 09/18/2022 FINDINGS: Lower chest: Heart is enlarged. Coronary artery calcifications are seen. There are calcifications along the mitral valve annulus. There is some linear opacity lung bases likely scar or atelectasis. No pleural effusion. Hepatobiliary: Gallbladder is nondilated but has a numerous luminal stones. Slight nodular contours of the liver. Patent portal vein. Once again there are several cystic areas in the liver which are quite small and too small to completely characterize. Based on prior report recommend continued follow up evaluation when appropriate such as MRI or short-term CT follow-up in 3-6 months. Pancreas: Unremarkable. No pancreatic ductal dilatation or surrounding inflammatory changes. Spleen: Stable small splenic cystic lesion. No specific imaging follow-up. Adrenals/Urinary Tract: Right adrenal gland is preserved. Slight thickening of the left adrenal gland. Nonspecific and unchanged. 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 No specific imaging follow up, unchanged. No collecting system dilatation. The ureters have  normal course and caliber extending down to the bladder. Preserved contours of the urinary bladder. Stomach/Bowel: Air-fluid level along the stomach. No  oral contrast. Small bowel is nondilated. Extensive colonic diverticula identified. There is wall thickening with stranding along the distal descending colon. This was seen on the prior examination and appears similar. This could be an area of diverticulitis. Please correlate with the history. No associated obstruction, free air or rim enhancing fluid collections. Vascular/Lymphatic: Moderate vascular calcifications are identified. Normal caliber aorta and IVC. No specific abnormal lymph node enlargement identified in the abdomen and pelvis. Reproductive: Status post hysterectomy. No adnexal masses. Other: Small fat containing left inguinal hernia. Musculoskeletal: Advanced degenerative changes of the spine and pelvis. Multilevel stenosis along the spine with bony fusion of the disc space of L2-3. Particular advanced degenerative changes of the right hip as well. Global fatty muscle atrophy. IMPRESSION: Colonic diverticulosis. Once again there is an area of wall thickening with stranding along the descending colon consistent with presumed area of diverticulitis. Recommend follow-up to confirm clearance and exclude secondary pathology. No associated obstruction, free air or fluid collections. Gallstones. As described previously there are multiple low-attenuation cystic lesions in the liver, too small to completely characterize. Probable benign cystic lesions but in principle are indeterminate as per the prior examination recommend continued follow-up with either short-term follow up CT scan in 3-6 months or MRI to further delineate as clinically appropriate. Electronically Signed   By: Karen Kays M.D.   On: 10/12/2022 10:33    Cardiac Studies   Echo  1. Left ventricular ejection fraction, by estimation, is 50 to 55%. The  left ventricle has low normal  function. Left ventricular endocardial  border not optimally defined to evaluate regional wall motion. There is  moderate left ventricular hypertrophy.  Left ventricular diastolic function could not be evaluated.   2. Right ventricular systolic function is mildly reduced. The right  ventricular size is moderately enlarged. There is mildly elevated  pulmonary artery systolic pressure.   3. Left atrial size was mildly dilated.   4. Right atrial size was severely dilated.   5. The mitral valve is degenerative. Trivial mitral valve regurgitation.   6. Tricuspid valve regurgitation is mild to moderate.   7. The aortic valve has an indeterminant number of cusps. Aortic valve  regurgitation is not visualized. No aortic stenosis is present.   8. Aortic dilatation noted. There is borderline dilatation of the aortic  root, measuring 39 mm.   9. The inferior vena cava is dilated in size with <50% respiratory  variability, suggesting right atrial pressure of 15 mmHg.   Patient Profile     Sheena Long is a 89 y.o. female with a hx of moderate dementia, chronic diastolic congestive heart failure, obesity, COPD, benign essential tremor, osteoarthritis of multiple joints, obstructive sleep apnea, venous insufficiency, peripheral neuropathy, depression, dyslipidemia, and prediabetes, former smoker who is being seen 10/12/2022 for the evaluation of atrial fibrillation RVR   Assessment & Plan    Atrial fibrillation with RVR Atrial fibrillation noted on arrival to the hospital Low normal ejection fraction on echo Started on amiodarone infusion and metoprolol 12.5 twice daily -Not an ideal candidate for anticoagulation due to recurrent falls -CHA2DS2-VASc score of at least 6 Converting to normal sinus rhythm We will continue amiodarone 400 twice daily for additional 5 days then down to 200 twice daily -Metoprolol tartrate 25 twice daily if blood pressure tolerates -Dose of IV Lasix x 1 given mildly  elevated right heart pressures, dilated IVC and tricuspid valve regurgitation on echo performed July 2  GI bleeding -Hemoglobin with slight trend down  GI following, no plan for intervention CT of the abdomen shows distal colonic diverticulitis and extensive diverticulosis   Hypokalemia Aggressive repletion today   Obstructive sleep apnea -Patient recommended to sleep with CPAP nightly but she has declined -Per family she does not use device at home as recommended   History of dementia/cognitive decline -History and account of events leading to hospitalization given by nephew and granddaughter   6.  Deconditioning, fall risk Would suggest continued evaluation by PT Had evaluation yesterday by PT  Long discussion with her concerning her home situation, stability, fall risk  Total encounter time more than 50 minutes  Greater than 50% was spent in counseling and coordination of care with the patient   For questions or updates, please contact [facility] Please consult [email or web address] for contact info under        Signed, Julien Nordmann, MD  10/14/2022, 9:49 AM

## 2022-10-14 NOTE — Progress Notes (Signed)
Progress Note    Sheena Long  QIH:[id number] DOB: December 02, 1931  DOA: 10/11/2022 PCP: Precious Reel, NP (Inactive)      Brief Narrative:    Medical records reviewed and are as summarized below:  Sheena Long is a 89 y.o. female with past medical history of dementia, COPD, CHF, and chronic hypoxic respiratory failure on 4 L nasal cannula, who presented to the hospital with rectal bleeding, general weakness and a fall at home.  Her home health nurse noticed that she had blood in her stool. She was hypotensive in the ED with initial blood pressure of 95/55.     Assessment/Plan:   Principal Problem:   Acute GI bleeding Active Problems:   Sleep apnea, obstructive   Atrial fibrillation (HCC)   Hypokalemia   AKI (acute kidney injury) (HCC)   Acute diverticulitis   Generalized weakness    Body mass index is 40.9 kg/m.  (Obesity)    Abdominal pain, rectal bleeding, extensive diverticulosis, distal colonic diverticulitis: Change IV Unasyn to Augmentin.  Continue analgesics as needed for pain.  Continue Protonix.   Appreciate input from gastroenterologist.   Atrial fibrillation with RVR: Heart rate is better.  IV amiodarone has been switched to oral amiodarone.  Continue metoprolol.  Status post IV Lasix x 1 dose on 10/14/2022. Not a candidate for anticoagulation at this time because of rectal bleeding and recurrent falls   Hypokalemia: Replete potassium and monitor levels   Hypotension: BP is better.   AKI: Improved   Macrocytosis: Check vitamin B12 level   Other comorbidities include dementia, COPD, chronic diastolic CHF, chronic hypoxic respiratory failure on 4 L/min oxygen    Diet Order             Diet regular Room service appropriate? Yes; Fluid consistency: Thin  Diet effective now                            Consultants: Gastroenterologist  Procedures: None    Medications:    amiodarone  400 mg Oral BID    cholecalciferol  500 Units Oral Daily   fluticasone furoate-vilanterol  1 puff Inhalation Daily   insulin aspart  0-15 Units Subcutaneous TID WC   insulin aspart  0-5 Units Subcutaneous QHS   metoprolol tartrate  25 mg Oral BID   pantoprazole  40 mg Oral BID   potassium chloride  40 mEq Oral Once   sodium chloride flush  3 mL Intravenous Q12H   Continuous Infusions:  ampicillin-sulbactam (UNASYN) IV 3 g (10/14/22 1014)     Anti-infectives (From admission, onward)    Start     Dose/Rate Route Frequency Ordered Stop   10/12/22 1600  Ampicillin-Sulbactam (UNASYN) 3 g in sodium chloride 0.9 % 100 mL IVPB        3 g 200 mL/hr over 30 Minutes Intravenous Every 6 hours 10/12/22 1519                Family Communication/Anticipated D/C date and plan/Code Status   DVT prophylaxis: SCDs Start: 10/11/22 2330     Code Status: DNR  Family Communication: None Disposition Plan: Plan to discharge home with home health in 1 to 2 days   Status is: Inpatient Remains inpatient appropriate because: Diverticulitis, atrial fibrillation       Subjective:   Interval events noted.  No bloody stools overnight.  No abdominal pain, chest pain, shortness of breath or dizziness.  Alex, RN, was at the bedside   Objective:    Vitals:   10/14/22 0355 10/14/22 0440 10/14/22 0745 10/14/22 1216  BP: 103/66  132/84 109/73  Pulse: 96  99 75  Resp: 20  20 20   Temp: 97.8 F (36.6 C)  97.7 F (36.5 C) 97.7 F (36.5 C)  TempSrc:   Oral Oral  SpO2: 98%  93% 100%  Weight:  95 kg     No data found.   Intake/Output Summary (Last 24 hours) at 10/14/2022 1452 Last data filed at 10/13/2022 1558 Gross per 24 hour  Intake --  Output 400 ml  Net -400 ml   Filed Weights   10/12/22 1517 10/13/22 0326 10/14/22 0440  Weight: 86.2 kg 88.4 kg 95 kg    Exam:  GEN: NAD SKIN: Warm and dry EYES: No pallor or icterus ENT: MMM CV: RRR PULM: CTA B ABD: soft, ND, NT, +BS CNS: AAO x 2 (person  and place), non focal EXT: No edema or tenderness       Data Reviewed:   I have personally reviewed following labs and imaging studies:  Labs: Labs show the following:   Basic Metabolic Panel: Recent Labs  Lab 10/11/22 1853 10/11/22 2217 10/12/22 0107 10/12/22 0529 10/13/22 0502 10/14/22 0549  NA 137  --   --  136 138 138  K 2.9*  --    < > 3.2* 3.4* 3.3*  CL 84*  --   --  91* 92* 94*  CO2 39*  --   --  38* 34* 32  GLUCOSE 111*  --   --  104* 79 79  BUN 44*  --   --  39* 25* 14  CREATININE 1.01*  --   --  0.79 0.71 0.55  CALCIUM 8.3*  --   --  7.4* 7.6* 7.4*  MG  --  2.0  --   --  2.0 2.1   < > = values in this interval not displayed.   GFR Estimated Creatinine Clearance: 49.1 mL/min (by C-G formula based on SCr of 0.55 mg/dL). Liver Function Tests: Recent Labs  Lab 10/11/22 1853  AST 38  ALT 11  ALKPHOS 47  BILITOT 1.5*  PROT 6.9  ALBUMIN 3.1*   No results for input(s): "LIPASE", "AMYLASE" in the last 168 hours. No results for input(s): "AMMONIA" in the last 168 hours. Coagulation profile Recent Labs  Lab 10/11/22 2056  INR 1.2    CBC: Recent Labs  Lab 10/11/22 1853 10/11/22 2217 10/12/22 0529 10/14/22 0549  WBC 9.2  --  9.2 7.1  HGB 13.0 13.1 12.5 11.1*  HCT 41.0 41.8 40.4 35.9*  MCV 105.1*  --  107.7* 107.8*  PLT 178  --  174 148*   Cardiac Enzymes: No results for input(s): "CKTOTAL", "CKMB", "CKMBINDEX", "TROPONINI" in the last 168 hours. BNP (last 3 results) No results for input(s): "PROBNP" in the last 8760 hours. CBG: Recent Labs  Lab 10/13/22 1154 10/13/22 1556 10/13/22 2043 10/14/22 0740 10/14/22 1212  GLUCAP 106* 118* 119* 74 144*   D-Dimer: No results for input(s): "DDIMER" in the last 72 hours. Hgb A1c: Recent Labs    10/12/22 0107  HGBA1C 5.4   Lipid Profile: No results for input(s): "CHOL", "HDL", "LDLCALC", "TRIG", "CHOLHDL", "LDLDIRECT" in the last 72 hours. Thyroid function studies: Recent Labs     10/12/22 1103  TSH 4.380  T4TOTAL 4.8   Anemia work up: No results for input(s): "VITAMINB12", "FOLATE", "FERRITIN", "TIBC", "IRON", "  RETICCTPCT" in the last 72 hours. Sepsis Labs: Recent Labs  Lab 10/11/22 1853 10/12/22 0529 10/14/22 0549  WBC 9.2 9.2 7.1    Microbiology No results found for this or any previous visit (from the past 240 hour(s)).  Procedures and diagnostic studies:  No results found.             LOS: 3 days   Vidur Knust  [facility]   Pager on [email or web address]. If 7PM-7AM, please contact night-coverage at [email or web address]     10/14/2022, 2:52 PM

## 2022-10-14 NOTE — Consult Note (Signed)
PHARMACY CONSULT NOTE - FOLLOW UP  Pharmacy Consult for Electrolyte Monitoring and Replacement   Recent Labs: Potassium (mmol/L)  Date Value  10/14/2022 3.3 (L)   Magnesium (mg/dL)  Date Value  64/33/2951 2.1   Calcium (mg/dL)  Date Value  88/41/6606 7.4 (L)   Albumin (g/dL)  Date Value  30/16/0109 3.1 (L)   Sodium (mmol/L)  Date Value  10/14/2022 138     Assessment: 89 year old female, with dementia and hx of CHF. Presented with possible GI bleed. K+ 2.9 > 3.2. Pt is on amiodarone gtt for new afib.  Holding home bumex.   Goal of Therapy:  Optimize electrolytes K >/= 4,  Mg>/= 2.   Plan: K remains below normal limits and below desired 4 mmol/L Replace with Kcl 40 mEq po Q4hr x 2 doses  and additional 40 meq in evening f/u with AM labs.   Bari Mantis PharmD Clinical Pharmacist 10/14/2022

## 2022-10-15 LAB — BASIC METABOLIC PANEL
Anion gap: 6 (ref 5–15)
BUN: 12 mg/dL (ref 8–23)
CO2: 37 mmol/L — ABNORMAL HIGH (ref 22–32)
Calcium: 8 mg/dL — ABNORMAL LOW (ref 8.9–10.3)
Chloride: 99 mmol/L (ref 98–111)
Creatinine, Ser: 0.63 mg/dL (ref 0.44–1.00)
GFR, Estimated: >60 mL/min (ref >60)
Glucose, Bld: 116 mg/dL — ABNORMAL HIGH (ref 70–99)
Potassium: 4.1 mmol/L (ref 3.5–5.1)
Sodium: 142 mmol/L (ref 135–145)

## 2022-10-15 LAB — GLUCOSE, CAPILLARY
Glucose-Capillary: 98 mg/dL (ref 70–99)
Glucose-Capillary: 94 mg/dL (ref 70–99)
Glucose-Capillary: 106 mg/dL — ABNORMAL HIGH (ref 70–99)

## 2022-10-15 LAB — VITAMIN B12: Vitamin B-12: 3424 pg/mL — ABNORMAL HIGH (ref 180–914)

## 2022-10-15 LAB — MAGNESIUM: Magnesium: 2 mg/dL (ref 1.7–2.4)

## 2022-10-15 MED ORDER — BUMETANIDE 1 MG PO TABS
2.0000 mg | ORAL_TABLET | Freq: Two times a day (BID) | ORAL | Status: DC
  Administered 2022-10-15: 2 mg via ORAL
  Filled 2022-10-15 (×2): qty 2

## 2022-10-15 MED ORDER — AMOXICILLIN-POT CLAVULANATE 875-125 MG PO TABS: 1.0000 | ORAL_TABLET | Freq: Two times a day (BID) | ORAL | 0 refills | Status: AC

## 2022-10-15 MED ORDER — AMIODARONE HCL 200 MG PO TABS: ORAL_TABLET | ORAL | 0 refills | Status: AC

## 2022-10-15 MED ORDER — METOPROLOL TARTRATE 25 MG PO TABS: 25.0000 mg | ORAL_TABLET | Freq: Two times a day (BID) | ORAL | 0 refills | Status: DC

## 2022-10-15 NOTE — TOC Transition Note (Addendum)
Transition of Care [facility]) - CM/SW Discharge Note   Patient Details  Name: Sheena Long MRN: [id number] Date of Birth: 06-22-1931  Transition of [facility]) CM/SW Contact:  Truddie Hidden, RN Phone Number: 10/15/2022, 12:13 PM   Clinical Narrative:    Sherron Monday with [facility] nurse [name]. Patient discharging by EMS to [facility] today per [name] Per Kathy's request RNCM will arrange EMS RNCM to fax discharge summary to [phone]  Spoke with patient at bedside to update her about discharge plan. Spoke with patient's nephew [name] to update about discharge plan.  He stated he had also spoken with [name] the [facility] nurse regarding patient's discharge for today.  Face sheet and medical necessity forms printed to the floor to be added to the EMS packet.  EMS arranged for patient to go to [facility].  Discharge summary sent and verified as received by [name] at [phone].  Spoke with Mariana Kaufman from EMS to confirm patient is discharging to [facility] on [address] [place].  2:30pm Spoke with [facility] Nurse, [name]. Patient declined by EMS for transport to [facility]. [name] stated patient is to return to home address and [facility] will meet her there.  EMS updated for transportation to home address.  Attempt to contact [name]. No answer. Left a message request a call back to [facility]    3:00pm Spoke with [name] to advise patient is returning home. He is requesting to be called once EMS arrives. Nurse notified.   TOC signing off.           Patient Goals and CMS Choice      Discharge Placement                         Discharge Plan and Services Additional resources added to the After Visit Summary for                                       Social Determinants of Health (SDOH) Interventions SDOH Screenings   Food Insecurity: No Food Insecurity (10/12/2022)  Housing: Low Risk  (10/12/2022)  Transportation Needs: No Transportation Needs (10/12/2022)  Utilities: Not At Risk (10/12/2022)  Tobacco Use: Medium Risk  (10/12/2022)     Readmission Risk Interventions     No data to display

## 2022-10-15 NOTE — Progress Notes (Signed)
Cardiology Progress Note   Patient Name: Sheena Long Date of Encounter: 10/15/2022  Primary Cardiologist: Yvonne Kendall, MD  Subjective   No complaints.  Received lasix yesterday - no output recorded - thinks she responded well.  Breathing at baseline.  Wants to go home.  Inpatient Medications    Scheduled Meds:  amiodarone  400 mg Oral BID   amoxicillin-clavulanate  1 tablet Oral Q12H   bumetanide  2 mg Oral BID   cholecalciferol  500 Units Oral Daily   fluticasone furoate-vilanterol  1 puff Inhalation Daily   insulin aspart  0-15 Units Subcutaneous TID WC   insulin aspart  0-5 Units Subcutaneous QHS   metoprolol tartrate  25 mg Oral BID   pantoprazole  40 mg Oral BID   sodium chloride flush  3 mL Intravenous Q12H   Continuous Infusions:  PRN Meds: acetaminophen **OR** acetaminophen, albuterol, diltiazem, LORazepam, morphine injection, naphazoline-glycerin, traZODone   Vital Signs    Vitals:   10/15/22 0408 10/15/22 0500 10/15/22 0824 10/15/22 0825  BP: 111/64  110/73 110/73  Pulse: (!) 56  (!) 54 66  Resp: 17  18 18   Temp: 98 F (36.7 C)  97.8 F (36.6 C) 97.8 F (36.6 C)  TempSrc:      SpO2: 94%  100% 100%  Weight:  98 kg      Intake/Output Summary (Last 24 hours) at 10/15/2022 1208 Last data filed at 10/15/2022 0841 Gross per 24 hour  Intake 2424.82 ml  Output --  Net 2424.82 ml   Filed Weights   10/13/22 0326 10/14/22 0440 10/15/22 0500  Weight: 88.4 kg 95 kg 98 kg    Physical Exam   GEN: Well nourished, well developed, in no acute distress.  HEENT: Grossly normal.  Neck: Supple, no JVD, carotid bruits, or masses. Cardiac: RRR, no murmurs, rubs, or gallops. No clubbing, cyanosis, 1+ bilat LE edema.  Radials 2+, DP/PT 2+ and equal bilaterally.  Respiratory:  Respirations regular and unlabored, diminished breath sounds bilat. GI: Soft, nontender, nondistended, BS + x 4. MS: no deformity or atrophy. Skin: warm and dry, no rash. Neuro:   Strength and sensation are intact. Psych: AAOx3.  Normal affect.  Labs    Chemistry Recent Labs  Lab 10/11/22 1853 10/12/22 0107 10/13/22 0502 10/14/22 0549 10/15/22 0659  NA 137   < > 138 138 142  K 2.9*   < > 3.4* 3.3* 4.1  CL 84*   < > 92* 94* 99  CO2 39*   < > 34* 32 37*  GLUCOSE 111*   < > 79 79 116*  BUN 44*   < > 25* 14 12  CREATININE 1.01*   < > 0.71 0.55 0.63  CALCIUM 8.3*   < > 7.6* 7.4* 8.0*  PROT 6.9  --   --   --   --   ALBUMIN 3.1*  --   --   --   --   AST 38  --   --   --   --   ALT 11  --   --   --   --   ALKPHOS 47  --   --   --   --   BILITOT 1.5*  --   --   --   --   GFRNONAA 53*   < > >60 >60 >60  ANIONGAP 14   < > 12 12 6    < > = values in this interval not displayed.  Hematology Recent Labs  Lab 10/11/22 1853 10/11/22 2217 10/12/22 0529 10/14/22 0549  WBC 9.2  --  9.2 7.1  RBC 3.90  --  3.75* 3.33*  HGB 13.0 13.1 12.5 11.1*  HCT 41.0 41.8 40.4 35.9*  MCV 105.1*  --  107.7* 107.8*  MCH 33.3  --  33.3 33.3  MCHC 31.7  --  30.9 30.9  RDW 13.6  --  13.6 13.3  PLT 178  --  174 148*    Cardiac Enzymes  Recent Labs  Lab 10/12/22 0107  TROPONINIHS 48*     HbA1c  Lab Results  Component Value Date   HGBA1C 5.4 10/12/2022    Radiology    CT ABDOMEN PELVIS W CONTRAST  Result Date: 10/12/2022 CLINICAL DATA:  Ulcerative colitis. History of new onset rectal bleeding. EXAM: CT ABDOMEN AND PELVIS WITH CONTRAST TECHNIQUE: Multidetector CT imaging of the abdomen and pelvis was performed using the standard protocol following bolus administration of intravenous contrast. RADIATION DOSE REDUCTION: This exam was performed according to the departmental dose-optimization program which includes automated exposure control, adjustment of the mA and/or kV according to patient size and/or use of iterative reconstruction technique. CONTRAST:  OMNIPAQUE IOHEXOL 300 MG/ML  SOLN COMPARISON:  CT 09/18/2022 FINDINGS: Lower chest: Heart is enlarged. Coronary  artery calcifications are seen. There are calcifications along the mitral valve annulus. There is some linear opacity lung bases likely scar or atelectasis. No pleural effusion. Hepatobiliary: Gallbladder is nondilated but has a numerous luminal stones. Slight nodular contours of the liver. Patent portal vein. Once again there are several cystic areas in the liver which are quite small and too small to completely characterize. Based on prior report recommend continued follow up evaluation when appropriate such as MRI or short-term CT follow-up in 3-6 months. Pancreas: Unremarkable. No pancreatic ductal dilatation or surrounding inflammatory changes. Spleen: Stable small splenic cystic lesion. No specific imaging follow-up. Adrenals/Urinary Tract: Right adrenal gland is preserved. Slight thickening of the left adrenal gland. Nonspecific and unchanged. 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 No specific imaging follow up, unchanged. No collecting system dilatation. The ureters have normal course and caliber extending down to the bladder. Preserved contours of the urinary bladder. Stomach/Bowel: Air-fluid level along the stomach. No oral contrast. Small bowel is nondilated. Extensive colonic diverticula identified. There is wall thickening with stranding along the distal descending colon. This was seen on the prior examination and appears similar. This could be an area of diverticulitis. Please correlate with the history. No associated obstruction, free air or rim enhancing fluid collections. Vascular/Lymphatic: Moderate vascular calcifications are identified. Normal caliber aorta and IVC. No specific abnormal lymph node enlargement identified in the abdomen and pelvis. Reproductive: Status post hysterectomy. No adnexal masses. Other: Small fat containing left inguinal hernia. Musculoskeletal: Advanced degenerative changes of the spine and pelvis. Multilevel stenosis along the spine with bony fusion of the  disc space of L2-3. Particular advanced degenerative changes of the right hip as well. Global fatty muscle atrophy. IMPRESSION: Colonic diverticulosis. Once again there is an area of wall thickening with stranding along the descending colon consistent with presumed area of diverticulitis. Recommend follow-up to confirm clearance and exclude secondary pathology. No associated obstruction, free air or fluid collections. Gallstones. As described previously there are multiple low-attenuation cystic lesions in the liver, too small to completely characterize. Probable benign cystic lesions but in principle are indeterminate as per the prior examination recommend continued follow-up with either short-term follow up CT scan  in 3-6 months or MRI to further delineate as clinically appropriate. Electronically Signed   By: Karen Kays M.D.   On: 10/12/2022 10:33   DG Chest Portable 1 View  Result Date: 10/11/2022 CLINICAL DATA:  Weakness EXAM: PORTABLE CHEST 1 VIEW COMPARISON:  07/28/2022 FINDINGS: Cardiac shadow is enlarged but stable. Aortic calcifications are again seen. Lungs are well aerated bilaterally. Mild left basilar scarring is noted. No acute abnormality noted. IMPRESSION: Mild left basilar scarring Electronically Signed   By: Alcide Clever M.D.   On: 10/11/2022 21:28   CT Head Wo Contrast  Result Date: 10/11/2022 CLINICAL DATA:  Recent fall with headaches and neck pain, initial encounter EXAM: CT HEAD WITHOUT CONTRAST CT CERVICAL SPINE WITHOUT CONTRAST TECHNIQUE: Multidetector CT imaging of the head and cervical spine was performed following the standard protocol without intravenous contrast. Multiplanar CT image reconstructions of the cervical spine were also generated. RADIATION DOSE REDUCTION: This exam was performed according to the departmental dose-optimization program which includes automated exposure control, adjustment of the mA and/or kV according to patient size and/or use of iterative  reconstruction technique. COMPARISON:  07/17/2022 FINDINGS: CT HEAD FINDINGS Brain: No evidence of acute infarction, hemorrhage, hydrocephalus, extra-axial collection or mass lesion/mass effect. Mild chronic white matter ischemic changes are seen. Vascular: No hyperdense vessel or unexpected calcification. Skull: Normal. Negative for fracture or focal lesion. Sinuses/Orbits: No acute finding. Other: None. CT CERVICAL SPINE FINDINGS Alignment: Mild loss of the normal cervical lordosis is noted. Skull base and vertebrae: 7 cervical segments are well visualized. Multilevel osteophytic change and facet hypertrophic changes are noted. No acute fracture or acute facet abnormality is noted. The odontoid is within normal limits. Soft tissues and spinal canal: Surrounding soft tissue structures are within normal limits. Heavy atherosclerotic calcifications are seen. Upper chest: Visualized lung apices are within normal limits. Other: None IMPRESSION: CT of the head: No acute intracranial abnormality noted. CT of the cervical spine: Multilevel degenerative change without acute abnormality. Electronically Signed   By: Alcide Clever M.D.   On: 10/11/2022 21:25   CT Cervical Spine Wo Contrast  Result Date: 10/11/2022 CLINICAL DATA:  Recent fall with headaches and neck pain, initial encounter EXAM: CT HEAD WITHOUT CONTRAST CT CERVICAL SPINE WITHOUT CONTRAST TECHNIQUE: Multidetector CT imaging of the head and cervical spine was performed following the standard protocol without intravenous contrast. Multiplanar CT image reconstructions of the cervical spine were also generated. RADIATION DOSE REDUCTION: This exam was performed according to the departmental dose-optimization program which includes automated exposure control, adjustment of the mA and/or kV according to patient size and/or use of iterative reconstruction technique. COMPARISON:  07/17/2022 FINDINGS: CT HEAD FINDINGS Brain: No evidence of acute infarction, hemorrhage,  hydrocephalus, extra-axial collection or mass lesion/mass effect. Mild chronic white matter ischemic changes are seen. Vascular: No hyperdense vessel or unexpected calcification. Skull: Normal. Negative for fracture or focal lesion. Sinuses/Orbits: No acute finding. Other: None. CT CERVICAL SPINE FINDINGS Alignment: Mild loss of the normal cervical lordosis is noted. Skull base and vertebrae: 7 cervical segments are well visualized. Multilevel osteophytic change and facet hypertrophic changes are noted. No acute fracture or acute facet abnormality is noted. The odontoid is within normal limits. Soft tissues and spinal canal: Surrounding soft tissue structures are within normal limits. Heavy atherosclerotic calcifications are seen. Upper chest: Visualized lung apices are within normal limits. Other: None IMPRESSION: CT of the head: No acute intracranial abnormality noted. CT of the cervical spine: Multilevel degenerative change without acute abnormality. Electronically  Signed   By: Alcide Clever M.D.   On: 10/11/2022 21:25    Telemetry    RSR, PVCs - Personally Reviewed  Cardiac Studies   2D Echocardiogram 7.2.2024  1. Left ventricular ejection fraction, by estimation, is 50 to 55%. The left ventricle has low normal function. Left ventricular endocardial border not optimally defined to evaluate regional wall motion. There is moderate left ventricular hypertrophy.  Left ventricular diastolic function could not be evaluated.   2. Right ventricular systolic function is mildly reduced. The right ventricular size is moderately enlarged. There is mildly elevated pulmonary artery systolic pressure.   3. Left atrial size was mildly dilated.   4. Right atrial size was severely dilated.   5. The mitral valve is degenerative. Trivial mitral valve regurgitation.   6. Tricuspid valve regurgitation is mild to moderate.   7. The aortic valve has an indeterminant number of cusps. Aortic valve  regurgitation is not  visualized. No aortic stenosis is present.   8. Aortic dilatation noted. There is borderline dilatation of the aortic  root, measuring 39 mm.   9. The inferior vena cava is dilated in size with <50% respiratory  variability, suggesting right atrial pressure of 15 mmHg.   Patient Profile     89 y.o. female with a hx of moderate dementia, chronic diastolic congestive heart failure, obesity, COPD, benign essential tremor, osteoarthritis of multiple joints, obstructive sleep apnea, venous insufficiency, peripheral neuropathy, depression, dyslipidemia, and prediabetes, former smoker who is being seen 10/12/2022 for the evaluation of atrial fibrillation RVR.  Assessment & Plan    1.  PAF w/ RVR:  afib on arrival.  Converted on amio infusion.  Maintaining sinus.  Cont amio 400 BID for 4 additional days w/ plan to drop to 200 bid.  Cont ? blocker.  (CHA2DSVASc = 6, however, no OAC due to recurrent falls.  2.  HFpEF:  lasix yesterday - no output recorded.  Net + for admission.  Diminished breath sounds on exam w/ 1+ bilat, somewhat woody edema.  Bumex 2mg  BID started this AM.  HR/BP stable.  3.  GIB/Anemai:  CT of abd w/ distal colonic diverticulitis and extensive diverticulosis.  H/H stable relatively stable yesterday - no f/u today.  Seen by GI w/o plan for intervention.  4.  Hypokalemia: Potassium normal today at 4.1.  5.  Obstructive sleep apnea: Previously declined CPAP at night.  Signed, Nicolasa Ducking, NP  10/15/2022, 12:08 PM    For questions or updates, please contact   Please consult [email or web address] for contact info under Cardiology/STEMI.

## 2022-10-15 NOTE — Care Management Important Message (Signed)
Important Message  Patient Details  Name: Sheena Long MRN: [id number] Date of Birth: 30-Nov-1931   Medicare Important Message Given:  Yes  Reviewed Medicare IM with Sheena Long, nephew, at [phone].  Verbal consent obtained.  Copy of Medicare IM mailed to nephew's attention at [address], [place], [place] [address].   Johnell Comings 10/15/2022, 1:24 PM

## 2022-10-15 NOTE — Progress Notes (Signed)
Occupational Therapy Treatment Patient Details Name: Sheena Long MRN: [id number] DOB: Feb 13, 1932 Today's Date: 10/15/2022   History of present illness Pt is an 89 year old admitted with GI bleed, a fib with RVR new onset, hypotension; hypokalemia; AKI; PMH significant for dementia, COPD, CHF, and chronic hypoxic respiratory failure on 3 L nasal cannula   OT comments  Chart reviewed, pt greeted in bed with son [name]) present. [facility] OT and [facility] nurse enter room during session. MD in room discussing dispo plan with therapist participating with recommendations as needed. Tx session targeted improving functional activity tolerance in the setting of functional transfers, in prep for toilet transfers. Squat pivot completed to bedside chair with MIN A with use of RW as a support with the R hand. MAX A required for LB dressing. Pt plans to discharge home with support from [facility]. OT will continue to follow acutely.    Recommendations for follow up therapy are one component of a multi-disciplinary discharge planning process, led by the attending physician.  Recommendations may be updated based on patient status, additional functional criteria and insurance authorization.    Assistance Recommended at Discharge Frequent or constant Supervision/Assistance  Patient can return home with the following  A lot of help with walking and/or transfers;A lot of help with bathing/dressing/bathroom   Equipment Recommendations  Hospital bed    Recommendations for Other Services      Precautions / Restrictions Precautions Precautions: Fall Restrictions Weight Bearing Restrictions: No       Mobility Bed Mobility Overal bed mobility: Needs Assistance Bed Mobility: Supine to Sit     Supine to sit: Mod assist, HOB elevated          Transfers Overall transfer level: Needs assistance Equipment used: Rolling walker (2 wheels) Transfers: Sit to/from Stand Sit to Stand: Min assist, Mod assist                  Balance Overall balance assessment: Needs assistance Sitting-balance support: Bilateral upper extremity supported, Feet supported Sitting balance-Leahy Scale: Good     Standing balance support: Bilateral upper extremity supported, Reliant on assistive device for balance Standing balance-Leahy Scale: Poor                             ADL either performed or assessed with clinical judgement   ADL Overall ADL's : Needs assistance/impaired Eating/Feeding: Set up;Sitting                   Lower Body Dressing: Maximal assistance   Toilet Transfer: Minimal assistance;Rolling walker (2 wheels);Cueing for sequencing;Cueing for safety;+2 for safety/equipment Toilet Transfer Details (indicate cue type and reason): step pivot to bedside chair, simulated                Extremity/Trunk Assessment              Vision       Perception     Praxis      Cognition Arousal/Alertness: Awake/alert Behavior During Therapy: WFL for tasks assessed/performed Overall Cognitive Status: History of cognitive impairments - at baseline                       Memory: Decreased short-term memory Following Commands: Follows one step commands with increased time     Problem Solving: Slow processing, Requires verbal cues, Requires tactile cues General Comments: oriented to self, place; familiar with [facility] staff and interacting approrpiately  Exercises Other Exercises Other Exercises: discussion re: equipment use at home, safe ADL completion, team in room during tx session discussing discharge dispo    Shoulder Instructions       General Comments spo2 85% on 2L via [place] after supine>sit transfer, >90% on 2 L via [place] after approx 30 seconds and PLB; spo2 >90% after transfer to the chair    Pertinent Vitals/ Pain       Pain Assessment Pain Assessment: No/denies pain  Home Living                                           Prior Functioning/Environment              Frequency  Min 1X/week        Progress Toward Goals  OT Goals(current goals can now be found in the care plan section)  Progress towards OT goals: Progressing toward goals     Plan Discharge plan needs to be updated    Co-evaluation                 AM-PAC OT "6 Clicks" Daily Activity     Outcome Measure   Help from another person eating meals?: None Help from another person taking care of personal grooming?: None Help from another person toileting, which includes using toliet, bedpan, or urinal?: A Lot Help from another person bathing (including washing, rinsing, drying)?: A Lot Help from another person to put on and taking off regular upper body clothing?: A Little Help from another person to put on and taking off regular lower body clothing?: A Lot 6 Click Score: 17    End of Session Equipment Utilized During Treatment: Gait belt;Rolling walker (2 wheels);Oxygen  OT Visit Diagnosis: Unsteadiness on feet (R26.81);Muscle weakness (generalized) (M62.81)   Activity Tolerance Patient tolerated treatment well   Patient Left in chair;with call bell/phone within reach;with chair alarm set   Nurse Communication Mobility status        Time: 0930-1004 OT Time Calculation (min): 34 min  Charges: OT General Charges $OT Visit: 1 Visit OT Treatments $Self Care/Home Management : 8-22 mins $Therapeutic Activity: 8-22 mins  Oleta Mouse, OTD OTR/L  10/15/22, 10:53 AM

## 2022-10-15 NOTE — Discharge Summary (Signed)
Physician Discharge Summary   Patient: Sheena Long MRN: [id number] DOB: 02-Dec-1931  Admit date:     10/11/2022  Discharge date: 10/15/22  Discharge Physician: Sheena Long   PCP: Sheena Reel, NP (Inactive)   Recommendations at discharge:   Follow-up with PCP within 1 week of discharge Follow-up with [facility] cardiology group (office will call to schedule appointment)  Discharge Diagnoses: Principal Problem:   Acute GI bleeding Active Problems:   Sleep apnea, obstructive   Atrial fibrillation (HCC)   Hypokalemia   AKI (acute kidney injury) (HCC)   Acute diverticulitis   Generalized weakness  Resolved Problems:   * No resolved hospital problems. [facility] Course:  Ms. Sheena Long is a 89 y.o. female with past medical history of dementia, COPD, chronic diastolic CHF, and chronic hypoxic respiratory failure on 4 L nasal cannula, who presented to the hospital with rectal bleeding, general weakness and a fall at home.  Her home health nurse noticed that she had blood in her stool. She was hypotensive in the ED with initial blood pressure of 95/55.   Assessment and Plan:   Abdominal pain, rectal bleeding, extensive diverticulosis, distal colonic diverticulitis: Improved.  She will be discharged on Augmentin for 7 days to complete 10 days of treatment.       Atrial fibrillation with RVR: She converted to normal sinus rhythm.  She will be discharged on metoprolol 25 mg twice daily, amiodarone 400 mg twice daily for 4 days followed by 200 mg daily.  Outpatient follow-up with cardiologist for further management. Not a candidate for anticoagulation at this time because of rectal bleeding and recurrent falls   Chronic diastolic CHF: Resume home bumetanide and metolazone at discharge     Hypokalemia: Improved.  Continue potassium supplement at discharge     Hypotension: BP is better.     AKI: Improved     Macrocytosis: Vitamin B12 level pending at time of discharge.   This can be followed up as an outpatient.  She is already on vitamin B12 supplement at home.    Other comorbidities include dementia, COPD, chronic diastolic CHF, chronic hypoxic respiratory failure on 4 L/min oxygen    PT recommended discharge to SNF.  However, patient prefers to go home.  She will be followed at by [facility] program.  Discharge plan was discussed with patient, Sheena Long (son), Sheena Long (her RN with [facility]) and Sheena Long (occupational therapist with [facility]). Sheena Long, occupational therapist, was at the bedside during this encounter        Consultants: Cardiologist, gastroenterologist Procedures performed: None Disposition: Home Diet recommendation:  Discharge Diet Orders (From admission, onward)     Start     Ordered   10/15/22 0000  Diet - low sodium heart healthy        10/15/22 1036           Cardiac diet DISCHARGE MEDICATION: Allergies as of 10/15/2022   No Known Allergies      Medication List     STOP taking these medications    azithromycin 250 MG tablet Commonly known as: Zithromax Z-Pak   sertraline 100 MG tablet Commonly known as: ZOLOFT   torsemide 20 MG tablet Commonly known as: DEMADEX       TAKE these medications    acetaminophen 650 MG CR tablet Commonly known as: TYLENOL Take 1,300 mg by mouth in the morning and at bedtime.   albuterol (2.5 MG/3ML) 0.083% nebulizer solution Commonly known as: PROVENTIL Take 2.5 mg  by nebulization every 6 (six) hours as needed.   amiodarone 200 MG tablet Commonly known as: PACERONE Take 2 tablets (400 mg total) by mouth 2 (two) times daily for 4 days, THEN 1 tablet (200 mg total) 2 (two) times daily for 26 days. Start taking on: October 15, 2022   amoxicillin-clavulanate 875-125 MG tablet Commonly known as: AUGMENTIN Take 1 tablet by mouth 2 (two) times daily for 7 days.   bumetanide 2 MG tablet Commonly known as: BUMEX Take 2 mg by mouth 2 (two) times daily.   cholecalciferol 25 MCG (1000  UNIT) tablet Commonly known as: VITAMIN D3 Take 1,000 Units by mouth daily.   cyanocobalamin 1000 MCG tablet Commonly known as: VITAMIN B12 Take 1,000 mcg by mouth daily.   gabapentin 300 MG capsule Commonly known as: NEURONTIN Take 300 mg by mouth at bedtime.   Jardiance 10 MG Tabs tablet Generic drug: empagliflozin Take 10 mg by mouth daily.   metolazone 2.5 MG tablet Commonly known as: ZAROXOLYN Take 2.5 mg by mouth daily.   metoprolol tartrate 25 MG tablet Commonly known as: LOPRESSOR Take 1 tablet (25 mg total) by mouth 2 (two) times daily.   nystatin powder Generic drug: nystatin Apply 1 Application topically 2 (two) times daily.   OPTIVE 0.5-0.9 % ophthalmic solution Generic drug: carboxymethylcellul-glycerin Place 1 drop into both eyes 3 (three) times daily as needed.   potassium chloride SA 20 MEQ tablet Commonly known as: KLOR-CON M Take 20 mEq by mouth daily.   Stool Softener 100 MG capsule Generic drug: docusate sodium Take 100 mg by mouth 2 (two) times daily.   Symbicort 80-4.5 MCG/ACT inhaler Generic drug: budesonide-formoterol Inhale 2 puffs into the lungs daily.        Discharge Exam: Filed Weights   10/13/22 0326 10/14/22 0440 10/15/22 0500  Weight: 88.4 kg 95 kg 98 kg   GEN: NAD SKIN: Warm and dry.  Chronic hyperpigmentation of bilateral legs EYES: No pallor or icterus ENT: MMM CV: RRR PULM: CTA B ABD: soft, obese, NT, +BS CNS: AAO x 3, non focal EXT: No edema or tenderness.    Condition at discharge: good  The results of significant diagnostics from this hospitalization (including imaging, microbiology, ancillary and laboratory) are listed below for reference.   Imaging Studies: ECHOCARDIOGRAM COMPLETE  Result Date: 10/12/2022    ECHOCARDIOGRAM REPORT   Patient Name:   Sheena Long Date of Exam: 10/12/2022 Medical Rec #:  [id number]        Height:       60.0 in Accession #:    [id number]       Weight:       185.0 lb Date of  Birth:  1932-02-05        BSA:          1.806 m Patient Age:    89 years         BP:           102/70 mmHg Patient Gender: F                HR:           102 bpm. Exam Location:  [facility] Procedure: 2D Echo, Cardiac Doppler and Color Doppler Indications:     Atrial Fibrillation  History:         Patient has no prior history of Echocardiogram examinations.                  Arrythmias:Atrial Fibrillation;  Risk Factors:Sleep Apnea.  Sonographer:     Mikki Harbor Referring Phys:  [id number] [facility] GOEL Diagnosing Phys: Yvonne Kendall MD  Sonographer Comments: Image acquisition challenging due to uncooperative patient. IMPRESSIONS  1. Left ventricular ejection fraction, by estimation, is 50 to 55%. The left ventricle has low normal function. Left ventricular endocardial border not optimally defined to evaluate regional wall motion. There is moderate left ventricular hypertrophy. Left ventricular diastolic function could not be evaluated.  2. Right ventricular systolic function is mildly reduced. The right ventricular size is moderately enlarged. There is mildly elevated pulmonary artery systolic pressure.  3. Left atrial size was mildly dilated.  4. Right atrial size was severely dilated.  5. The mitral valve is degenerative. Trivial mitral valve regurgitation.  6. Tricuspid valve regurgitation is mild to moderate.  7. The aortic valve has an indeterminant number of cusps. Aortic valve regurgitation is not visualized. No aortic stenosis is present.  8. Aortic dilatation noted. There is borderline dilatation of the aortic root, measuring 39 mm.  9. The inferior vena cava is dilated in size with <50% respiratory variability, suggesting right atrial pressure of 15 mmHg. FINDINGS  Left Ventricle: Left ventricular ejection fraction, by estimation, is 50 to 55%. The left ventricle has low normal function. Left ventricular endocardial border not optimally defined to evaluate regional wall motion. The left ventricular internal  cavity  size was normal in size. There is moderate left ventricular hypertrophy. Left ventricular diastolic function could not be evaluated due to atrial fibrillation. Left ventricular diastolic function could not be evaluated. Right Ventricle: The right ventricular size is moderately enlarged. No increase in right ventricular wall thickness. Right ventricular systolic function is mildly reduced. There is mildly elevated pulmonary artery systolic pressure. The tricuspid regurgitant velocity is 2.51 m/s, and with an assumed right atrial pressure of 15 mmHg, the estimated right ventricular systolic pressure is 40.2 mmHg. Left Atrium: Left atrial size was mildly dilated. Right Atrium: Right atrial size was severely dilated. Pericardium: There is no evidence of pericardial effusion. Mitral Valve: The mitral valve is degenerative in appearance. There is mild thickening of the mitral valve leaflet(s). Mild to moderate mitral annular calcification. Trivial mitral valve regurgitation. MV peak gradient, 6.9 mmHg. The mean mitral valve gradient is 1.0 mmHg. Tricuspid Valve: The tricuspid valve is normal in structure. Tricuspid valve regurgitation is mild to moderate. Aortic Valve: The aortic valve has an indeterminant number of cusps. Aortic valve regurgitation is not visualized. No aortic stenosis is present. Aortic valve mean gradient measures 3.5 mmHg. Aortic valve peak gradient measures 7.0 mmHg. Aortic valve area, by VTI measures 1.43 cm. Pulmonic Valve: The pulmonic valve was not well visualized. Pulmonic valve regurgitation is mild. No evidence of pulmonic stenosis. Aorta: Aortic dilatation noted. There is borderline dilatation of the aortic root, measuring 39 mm. Venous: The inferior vena cava is dilated in size with less than 50% respiratory variability, suggesting right atrial pressure of 15 mmHg. IAS/Shunts: The interatrial septum was not well visualized.  LEFT VENTRICLE PLAX 2D LVIDd:         4.90 cm LVIDs:          2.20 cm LV PW:         1.40 cm LV IVS:        1.40 cm LVOT diam:     2.00 cm LV SV:         35 LV SV Index:   19 LVOT Area:     3.14 cm  RIGHT  VENTRICLE RV Basal diam:  4.50 cm RV Mid diam:    3.60 cm LEFT ATRIUM           Index        RIGHT ATRIUM           Index LA diam:      3.50 cm 1.94 cm/m   RA Area:     26.60 cm LA Vol (A4C): 59.1 ml 32.73 ml/m  RA Volume:   97.90 ml  54.22 ml/m  AORTIC VALVE                    PULMONIC VALVE AV Area (Vmax):    1.80 cm     PV Vmax:       1.15 m/s AV Area (Vmean):   1.78 cm     PV Peak grad:  5.3 mmHg AV Area (VTI):     1.43 cm AV Vmax:           132.50 cm/s AV Vmean:          85.800 cm/s AV VTI:            0.244 m AV Peak Grad:      7.0 mmHg AV Mean Grad:      3.5 mmHg LVOT Vmax:         75.80 cm/s LVOT Vmean:        48.700 cm/s LVOT VTI:          0.111 m LVOT/AV VTI ratio: 0.45  AORTA Ao Root diam: 3.90 cm MITRAL VALVE                TRICUSPID VALVE MV Area (PHT): 3.76 cm     TR Peak grad:   25.2 mmHg MV Area VTI:   1.00 cm     TR Vmax:        251.00 cm/s MV Peak grad:  6.9 mmHg MV Mean grad:  1.0 mmHg     SHUNTS MV Vmax:       1.31 m/s     Systemic VTI:  0.11 m MV Vmean:      44.8 cm/s    Systemic Diam: 2.00 cm MV Decel Time: 202 msec MV E velocity: 123.00 cm/s Yvonne Kendall MD Electronically signed by Yvonne Kendall MD Signature Date/Time: 10/12/2022/7:09:16 PM    Final    CT ABDOMEN PELVIS W CONTRAST  Result Date: 10/12/2022 CLINICAL DATA:  Ulcerative colitis. History of new onset rectal bleeding. EXAM: CT ABDOMEN AND PELVIS WITH CONTRAST TECHNIQUE: Multidetector CT imaging of the abdomen and pelvis was performed using the standard protocol following bolus administration of intravenous contrast. RADIATION DOSE REDUCTION: This exam was performed according to the departmental dose-optimization program which includes automated exposure control, adjustment of the mA and/or kV according to patient size and/or use of iterative reconstruction technique. CONTRAST:   OMNIPAQUE IOHEXOL 300 MG/ML  SOLN COMPARISON:  CT 09/18/2022 FINDINGS: Lower chest: Heart is enlarged. Coronary artery calcifications are seen. There are calcifications along the mitral valve annulus. There is some linear opacity lung bases likely scar or atelectasis. No pleural effusion. Hepatobiliary: Gallbladder is nondilated but has a numerous luminal stones. Slight nodular contours of the liver. Patent portal vein. Once again there are several cystic areas in the liver which are quite small and too small to completely characterize. Based on prior report recommend continued follow up evaluation when appropriate such as MRI or short-term CT follow-up in 3-6 months. Pancreas: Unremarkable. No pancreatic ductal  dilatation or surrounding inflammatory changes. Spleen: Stable small splenic cystic lesion. No specific imaging follow-up. Adrenals/Urinary Tract: Right adrenal gland is preserved. Slight thickening of the left adrenal gland. Nonspecific and unchanged. 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 No specific imaging follow up, unchanged. No collecting system dilatation. The ureters have normal course and caliber extending down to the bladder. Preserved contours of the urinary bladder. Stomach/Bowel: Air-fluid level along the stomach. No oral contrast. Small bowel is nondilated. Extensive colonic diverticula identified. There is wall thickening with stranding along the distal descending colon. This was seen on the prior examination and appears similar. This could be an area of diverticulitis. Please correlate with the history. No associated obstruction, free air or rim enhancing fluid collections. Vascular/Lymphatic: Moderate vascular calcifications are identified. Normal caliber aorta and IVC. No specific abnormal lymph node enlargement identified in the abdomen and pelvis. Reproductive: Status post hysterectomy. No adnexal masses. Other: Small fat containing left inguinal hernia.  Musculoskeletal: Advanced degenerative changes of the spine and pelvis. Multilevel stenosis along the spine with bony fusion of the disc space of L2-3. Particular advanced degenerative changes of the right hip as well. Global fatty muscle atrophy. IMPRESSION: Colonic diverticulosis. Once again there is an area of wall thickening with stranding along the descending colon consistent with presumed area of diverticulitis. Recommend follow-up to confirm clearance and exclude secondary pathology. No associated obstruction, free air or fluid collections. Gallstones. As described previously there are multiple low-attenuation cystic lesions in the liver, too small to completely characterize. Probable benign cystic lesions but in principle are indeterminate as per the prior examination recommend continued follow-up with either short-term follow up CT scan in 3-6 months or MRI to further delineate as clinically appropriate. Electronically Signed   By: Karen Kays M.D.   On: 10/12/2022 10:33   DG Chest Portable 1 View  Result Date: 10/11/2022 CLINICAL DATA:  Weakness EXAM: PORTABLE CHEST 1 VIEW COMPARISON:  07/28/2022 FINDINGS: Cardiac Long is enlarged but stable. Aortic calcifications are again seen. Lungs are well aerated bilaterally. Mild left basilar scarring is noted. No acute abnormality noted. IMPRESSION: Mild left basilar scarring Electronically Signed   By: Alcide Clever M.D.   On: 10/11/2022 21:28   CT Head Wo Contrast  Result Date: 10/11/2022 CLINICAL DATA:  Recent fall with headaches and neck pain, initial encounter EXAM: CT HEAD WITHOUT CONTRAST CT CERVICAL SPINE WITHOUT CONTRAST TECHNIQUE: Multidetector CT imaging of the head and cervical spine was performed following the standard protocol without intravenous contrast. Multiplanar CT image reconstructions of the cervical spine were also generated. RADIATION DOSE REDUCTION: This exam was performed according to the departmental dose-optimization program  which includes automated exposure control, adjustment of the mA and/or kV according to patient size and/or use of iterative reconstruction technique. COMPARISON:  07/17/2022 FINDINGS: CT HEAD FINDINGS Brain: No evidence of acute infarction, hemorrhage, hydrocephalus, extra-axial collection or mass lesion/mass effect. Mild chronic white matter ischemic changes are seen. Vascular: No hyperdense vessel or unexpected calcification. Skull: Normal. Negative for fracture or focal lesion. Sinuses/Orbits: No acute finding. Other: None. CT CERVICAL SPINE FINDINGS Alignment: Mild loss of the normal cervical lordosis is noted. Skull base and vertebrae: 7 cervical segments are well visualized. Multilevel osteophytic change and facet hypertrophic changes are noted. No acute fracture or acute facet abnormality is noted. The odontoid is within normal limits. Soft tissues and spinal canal: Surrounding soft tissue structures are within normal limits. Heavy atherosclerotic calcifications are seen. Upper chest: Visualized lung apices are within  normal limits. Other: None IMPRESSION: CT of the head: No acute intracranial abnormality noted. CT of the cervical spine: Multilevel degenerative change without acute abnormality. Electronically Signed   By: Alcide Clever M.D.   On: 10/11/2022 21:25   CT Cervical Spine Wo Contrast  Result Date: 10/11/2022 CLINICAL DATA:  Recent fall with headaches and neck pain, initial encounter EXAM: CT HEAD WITHOUT CONTRAST CT CERVICAL SPINE WITHOUT CONTRAST TECHNIQUE: Multidetector CT imaging of the head and cervical spine was performed following the standard protocol without intravenous contrast. Multiplanar CT image reconstructions of the cervical spine were also generated. RADIATION DOSE REDUCTION: This exam was performed according to the departmental dose-optimization program which includes automated exposure control, adjustment of the mA and/or kV according to patient size and/or use of iterative  reconstruction technique. COMPARISON:  07/17/2022 FINDINGS: CT HEAD FINDINGS Brain: No evidence of acute infarction, hemorrhage, hydrocephalus, extra-axial collection or mass lesion/mass effect. Mild chronic white matter ischemic changes are seen. Vascular: No hyperdense vessel or unexpected calcification. Skull: Normal. Negative for fracture or focal lesion. Sinuses/Orbits: No acute finding. Other: None. CT CERVICAL SPINE FINDINGS Alignment: Mild loss of the normal cervical lordosis is noted. Skull base and vertebrae: 7 cervical segments are well visualized. Multilevel osteophytic change and facet hypertrophic changes are noted. No acute fracture or acute facet abnormality is noted. The odontoid is within normal limits. Soft tissues and spinal canal: Surrounding soft tissue structures are within normal limits. Heavy atherosclerotic calcifications are seen. Upper chest: Visualized lung apices are within normal limits. Other: None IMPRESSION: CT of the head: No acute intracranial abnormality noted. CT of the cervical spine: Multilevel degenerative change without acute abnormality. Electronically Signed   By: Alcide Clever M.D.   On: 10/11/2022 21:25   CT ABDOMEN PELVIS W CONTRAST  Result Date: 09/18/2022 CLINICAL DATA:  Bowel obstruction, acute nonlocalized abdominal pain EXAM: CT ABDOMEN AND PELVIS WITH CONTRAST TECHNIQUE: Multidetector CT imaging of the abdomen and pelvis was performed using the standard protocol following bolus administration of intravenous contrast. RADIATION DOSE REDUCTION: This exam was performed according to the departmental dose-optimization program which includes automated exposure control, adjustment of the mA and/or kV according to patient size and/or use of iterative reconstruction technique. CONTRAST:  OMNIPAQUE IOHEXOL 300 MG/ML  SOLN COMPARISON:  None Available. FINDINGS: Lower chest: No acute abnormality. Mild cardiomegaly. Multi-vessel coronary artery calcification, extensive  within the left anterior descending coronary artery proximally. Small hiatal hernia. Hepatobiliary: Cholelithiasis without pericholecystic inflammatory change noted. Multiple scattered hepatic hypodensities are seen throughout the liver which are largely too small to characterize though the largest of which seen within segment 4 B is in keeping with a simple cyst. These lesions were not clearly identified, however, on a prior sonogram of 07/01/2016 and are therefore indeterminate. No enhancing intrahepatic mass identified. No intra or extrahepatic biliary ductal dilation. Pancreas: Unremarkable Spleen: Unremarkable Adrenals/Urinary Tract: The adrenal glands are unremarkable. The kidneys are normal in size and position. Multiple simple cortical cysts are seen within the kidneys bilaterally for which no follow-up imaging is recommended. The kidneys are otherwise unremarkable. The bladder is unremarkable. Stomach/Bowel: There is severe distal colonic diverticulosis, most severe within the sigmoid colon. There is superimposed mild pericolonic inflammatory stranding involving the proximal sigmoid colon, best seen on axial image # 44/3 which may reflect changes of very mild superimposed uncomplicated sigmoid diverticulitis. The stomach, small bowel, and large bowel are otherwise unremarkable. No evidence of obstruction. No free intraperitoneal gas or fluid. The appendix is not  clearly identified and may be absent. Vascular/Lymphatic: Extensive aortoiliac atherosclerotic calcification. Particularly prominent atherosclerotic calcification is seen at the origin of the mesenteric and renal vasculature bilaterally, however, the degree of stenosis is not well assessed on this examination. No aortic aneurysm. No pathologic adenopathy within the abdomen and pelvis. Reproductive: Status post hysterectomy. No adnexal masses. Other: No abdominal wall hernia Musculoskeletal: Advanced degenerative changes are seen within the lumbar  spine and asymmetrically within the right hip. Probable degenerative ankylosis of the L2-3 vertebral bodies. No acute bone abnormality. No lytic or blastic bone lesion. IMPRESSION: 1. Severe distal colonic diverticulosis, most severe within the sigmoid colon. Superimposed mild pericolonic inflammatory stranding involving the proximal sigmoid colon may reflect changes of very mild superimposed uncomplicated sigmoid diverticulitis. 2. Cholelithiasis. 3. Multi-vessel coronary artery calcification, extensive within the left anterior descending coronary artery proximally. 4. Extensive aortoiliac atherosclerotic calcification. Particularly prominent atherosclerotic calcification is seen at the origin of the mesenteric and renal vasculature bilaterally, however, the degree of stenosis is not well assessed on this examination. If there is clinical evidence of chronic mesenteric ischemia or hemodynamically significant renal artery stenosis, CT arteriography may be helpful for further evaluation. 5. Multiple scattered hepatic hypodensities, largely too small to characterize though the largest of which is in keeping with a simple cyst. These lesions were not clearly identified on a prior sonogram of 07/01/2016 and are therefore indeterminate. If indicated, this could be further assessed with dedicated MRI examination. 6. Advanced degenerative changes within the lumbar spine and asymmetrically within the right hip. Aortic Atherosclerosis (ICD10-I70.0). Electronically Signed   By: Helyn Numbers M.D.   On: 09/18/2022 21:09    Microbiology: No results found for this or any previous visit.  Labs: CBC: Recent Labs  Lab 10/11/22 1853 10/11/22 2217 10/12/22 0529 10/14/22 0549  WBC 9.2  --  9.2 7.1  HGB 13.0 13.1 12.5 11.1*  HCT 41.0 41.8 40.4 35.9*  MCV 105.1*  --  107.7* 107.8*  PLT 178  --  174 148*   Basic Metabolic Panel: Recent Labs  Lab 10/11/22 1853 10/11/22 2217 10/12/22 0107 10/12/22 0529  10/13/22 0502 10/14/22 0549 10/15/22 0659  NA 137  --   --  136 138 138 142  K 2.9*  --  3.9 3.2* 3.4* 3.3* 4.1  CL 84*  --   --  91* 92* 94* 99  CO2 39*  --   --  38* 34* 32 37*  GLUCOSE 111*  --   --  104* 79 79 116*  BUN 44*  --   --  39* 25* 14 12  CREATININE 1.01*  --   --  0.79 0.71 0.55 0.63  CALCIUM 8.3*  --   --  7.4* 7.6* 7.4* 8.0*  MG  --  2.0  --   --  2.0 2.1 2.0   Liver Function Tests: Recent Labs  Lab 10/11/22 1853  AST 38  ALT 11  ALKPHOS 47  BILITOT 1.5*  PROT 6.9  ALBUMIN 3.1*   CBG: Recent Labs  Lab 10/14/22 0740 10/14/22 1212 10/14/22 1638 10/14/22 2117 10/15/22 0828  GLUCAP 74 144* 116* 110* 98    Discharge time spent: greater than 30 minutes.  Signed: Lurene Shadow, MD [facility] 10/15/2022

## 2022-10-15 NOTE — Consult Note (Signed)
PHARMACY CONSULT NOTE - FOLLOW UP  Pharmacy Consult for Electrolyte Monitoring and Replacement   Recent Labs: Potassium (mmol/L)  Date Value  10/15/2022 4.1   Magnesium (mg/dL)  Date Value  40/98/1191 2.0   Calcium (mg/dL)  Date Value  47/82/9562 8.0 (L)   Albumin (g/dL)  Date Value  13/11/6576 3.1 (L)   Sodium (mmol/L)  Date Value  10/15/2022 142     Assessment: 89 year old female, with dementia and hx of CHF. Presented with possible GI bleed. K+ 2.9 > 3.2. Pt is on amiodarone gtt for new afib.  Holding home bumex.   Goal of Therapy:  Optimize electrolytes K >/= 4,  Mg>/= 2.   Plan: K and Mg at goal NO electrolytes replacement needed today f/u with AM labs  Shaquill Iseman Rodriguez-Guzman PharmD, BCPS 10/15/2022 7:57 AM

## 2022-11-11 DEATH — deceased
